# Patient Record
Sex: Female | Born: 1970 | Race: White | Hispanic: No | Marital: Married | Smoking: Current every day smoker
Health system: Southern US, Community
[De-identification: ages and names within clinical notes are randomized; demographics above are authoritative.]

## PROBLEM LIST (undated history)

## (undated) ENCOUNTER — Emergency Department (HOSPITAL_COMMUNITY)

## (undated) DIAGNOSIS — E7800 Pure hypercholesterolemia, unspecified: Secondary | ICD-10-CM

## (undated) DIAGNOSIS — E119 Type 2 diabetes mellitus without complications: Secondary | ICD-10-CM

## (undated) DIAGNOSIS — R42 Dizziness and giddiness: Secondary | ICD-10-CM

## (undated) HISTORY — PX: KNEE SURGERY: SHX244

## (undated) HISTORY — PX: CHOLECYSTECTOMY: SHX55

## (undated) HISTORY — PX: EYE SURGERY: SHX253

---

## 2005-04-08 ENCOUNTER — Ambulatory Visit (HOSPITAL_COMMUNITY): Admission: RE | Admit: 2005-04-08 | Discharge: 2005-04-08 | Payer: Self-pay | Admitting: Family Medicine

## 2006-07-13 ENCOUNTER — Ambulatory Visit (HOSPITAL_COMMUNITY): Payer: Self-pay | Admitting: Oncology

## 2006-07-13 ENCOUNTER — Encounter (HOSPITAL_COMMUNITY): Admission: RE | Admit: 2006-07-13 | Discharge: 2006-08-12 | Payer: Self-pay | Admitting: Oncology

## 2006-10-06 ENCOUNTER — Encounter (HOSPITAL_COMMUNITY): Admission: RE | Admit: 2006-10-06 | Discharge: 2006-11-05 | Payer: Self-pay | Admitting: Oncology

## 2006-10-06 ENCOUNTER — Ambulatory Visit (HOSPITAL_COMMUNITY): Payer: Self-pay | Admitting: Oncology

## 2006-10-28 ENCOUNTER — Ambulatory Visit (HOSPITAL_COMMUNITY): Admission: RE | Admit: 2006-10-28 | Discharge: 2006-10-28 | Payer: Self-pay | Admitting: Family Medicine

## 2006-12-07 ENCOUNTER — Ambulatory Visit (HOSPITAL_COMMUNITY): Payer: Self-pay | Admitting: Oncology

## 2006-12-07 ENCOUNTER — Encounter (HOSPITAL_COMMUNITY): Admission: RE | Admit: 2006-12-07 | Discharge: 2006-12-29 | Payer: Self-pay | Admitting: Oncology

## 2007-09-17 ENCOUNTER — Encounter: Admission: RE | Admit: 2007-09-17 | Discharge: 2007-09-17 | Payer: Self-pay | Admitting: Neurosurgery

## 2008-04-13 ENCOUNTER — Encounter
Admission: RE | Admit: 2008-04-13 | Discharge: 2008-04-13 | Payer: Self-pay | Admitting: Physical Medicine & Rehabilitation

## 2008-11-24 ENCOUNTER — Emergency Department (HOSPITAL_COMMUNITY): Admission: EM | Admit: 2008-11-24 | Discharge: 2008-11-25 | Payer: Self-pay | Admitting: Emergency Medicine

## 2010-07-06 LAB — URINALYSIS, ROUTINE W REFLEX MICROSCOPIC
Bilirubin Urine: NEGATIVE
Glucose, UA: >1000 mg/dL — AB
Ketones, ur: NEGATIVE mg/dL
Leukocytes, UA: NEGATIVE
pH: 5.5 (ref 5.0–8.0)

## 2010-07-06 LAB — URINE MICROSCOPIC-ADD ON

## 2010-07-06 LAB — GLUCOSE, CAPILLARY: Glucose-Capillary: 236 mg/dL — ABNORMAL HIGH (ref 70–99)

## 2011-01-10 LAB — DIFFERENTIAL
Basophils Absolute: 0.1
Basophils Relative: 1
Eosinophils Relative: 2
Monocytes Absolute: 0.9 — ABNORMAL HIGH
Monocytes Relative: 7
Neutro Abs: 7.1
Neutrophils Relative %: 57

## 2011-01-10 LAB — CBC
MCV: 93.2
RBC: 4.64
WBC: 12.5 — ABNORMAL HIGH

## 2011-01-14 LAB — CBC
HCT: 42.9
Hemoglobin: 14.9
MCV: 91.3
Platelets: 284

## 2011-01-14 LAB — DIFFERENTIAL
Basophils Absolute: 0.1
Eosinophils Relative: 2
Lymphocytes Relative: 40
Monocytes Absolute: 1 — ABNORMAL HIGH
Monocytes Relative: 9
Neutro Abs: 5.6
Neutrophils Relative %: 49

## 2014-04-25 ENCOUNTER — Ambulatory Visit (HOSPITAL_COMMUNITY)
Admission: RE | Admit: 2014-04-25 | Discharge: 2014-04-25 | Disposition: A | Discharge status: Home / Self Care | Payer: Self-pay | Source: Ambulatory Visit | Attending: Family Medicine | Admitting: Family Medicine

## 2014-04-25 ENCOUNTER — Other Ambulatory Visit (HOSPITAL_COMMUNITY): Payer: Self-pay | Admitting: Family Medicine

## 2014-04-25 DIAGNOSIS — M545 Low back pain, unspecified: Secondary | ICD-10-CM

## 2014-04-25 DIAGNOSIS — W000XXA Fall on same level due to ice and snow, initial encounter: Secondary | ICD-10-CM | POA: Insufficient documentation

## 2016-10-29 ENCOUNTER — Emergency Department (HOSPITAL_COMMUNITY)
Admission: EM | Admit: 2016-10-29 | Discharge: 2016-10-29 | Disposition: A | Discharge status: Home / Self Care | Payer: Self-pay | Attending: Emergency Medicine | Admitting: Emergency Medicine

## 2016-10-29 ENCOUNTER — Encounter (HOSPITAL_COMMUNITY): Payer: Self-pay | Admitting: Emergency Medicine

## 2016-10-29 DIAGNOSIS — X500XXA Overexertion from strenuous movement or load, initial encounter: Secondary | ICD-10-CM | POA: Insufficient documentation

## 2016-10-29 DIAGNOSIS — Z7984 Long term (current) use of oral hypoglycemic drugs: Secondary | ICD-10-CM | POA: Insufficient documentation

## 2016-10-29 DIAGNOSIS — S39012A Strain of muscle, fascia and tendon of lower back, initial encounter: Secondary | ICD-10-CM | POA: Insufficient documentation

## 2016-10-29 DIAGNOSIS — Y9389 Activity, other specified: Secondary | ICD-10-CM | POA: Insufficient documentation

## 2016-10-29 DIAGNOSIS — Z79899 Other long term (current) drug therapy: Secondary | ICD-10-CM | POA: Insufficient documentation

## 2016-10-29 DIAGNOSIS — Y998 Other external cause status: Secondary | ICD-10-CM | POA: Insufficient documentation

## 2016-10-29 DIAGNOSIS — Y929 Unspecified place or not applicable: Secondary | ICD-10-CM | POA: Insufficient documentation

## 2016-10-29 DIAGNOSIS — E119 Type 2 diabetes mellitus without complications: Secondary | ICD-10-CM | POA: Insufficient documentation

## 2016-10-29 HISTORY — DX: Type 2 diabetes mellitus without complications: E11.9

## 2016-10-29 MED ORDER — CYCLOBENZAPRINE HCL 10 MG PO TABS: 10.0000 mg | ORAL_TABLET | Freq: Two times a day (BID) | ORAL | 0 refills | Status: DC | PRN

## 2016-10-29 MED ORDER — NAPROXEN 250 MG PO TABS
500.0000 mg | ORAL_TABLET | Freq: Once | ORAL | Status: AC
  Administered 2016-10-29: 500 mg via ORAL
  Filled 2016-10-29: qty 2

## 2016-10-29 MED ORDER — NAPROXEN 500 MG PO TABS: 500.0000 mg | ORAL_TABLET | Freq: Two times a day (BID) | ORAL | 0 refills | Status: DC

## 2016-10-29 NOTE — ED Provider Notes (Signed)
[facility] DEPT Provider Note   CSN: [id number] Arrival date & time: 10/29/16  1003     History   Chief Complaint Chief Complaint  Patient presents with  . Back Pain    HPI Cheryl Dickerson is a 46 y.o. female.  The history is provided by the patient.  Back Pain   This is a new problem. The current episode started 2 days ago. The problem occurs constantly. The problem has not changed since onset.The pain is associated with lifting heavy objects (Patient symptoms started after she was bending over at work). The pain is present in the lumbar spine. The quality of the pain is described as aching. The pain is moderate. The symptoms are aggravated by bending, twisting and certain positions. Pertinent negatives include no chest pain, no fever, no abdominal pain, no dysuria, no paresthesias, no paresis, no tingling and no weakness.    Past Medical History:  Diagnosis Date  . Diabetes mellitus without complication ([place])     There are no active problems to display for this patient.   Past Surgical History:  Procedure Laterality Date  . CESAREAN SECTION    . CHOLECYSTECTOMY    . EYE SURGERY    . KNEE SURGERY      OB History    No data available       Home Medications    Prior to Admission medications   Medication Sig Start Date End Date Taking? Authorizing Provider  ibuprofen (ADVIL,MOTRIN) 200 MG tablet Take 800 mg by mouth every 6 (six) hours as needed.   Yes [provider]  metFORMIN (GLUCOPHAGE) 500 MG tablet Take 500 mg by mouth 2 (two) times daily with a meal.   Yes [provider]  cyclobenzaprine (FLEXERIL) 10 MG tablet Take 1 tablet (10 mg total) by mouth 2 (two) times daily as needed for muscle spasms. 10/29/16   Dorie Rank, MD  naproxen (NAPROSYN) 500 MG tablet Take 1 tablet (500 mg total) by mouth 2 (two) times daily. 10/29/16   Dorie Rank, MD    Family History No family history on file.  Social History Social History  Substance Use  Topics  . Smoking status: Current Every Day Smoker    Packs/day: 0.50    Types: Cigarettes  . Smokeless tobacco: Never Used  . Alcohol use Yes     Comment: rare     Allergies   Patient has no known allergies.   Review of Systems Review of Systems  Constitutional: Negative for fever.  Cardiovascular: Negative for chest pain.  Gastrointestinal: Negative for abdominal pain.  Genitourinary: Negative for dysuria.  Musculoskeletal: Positive for back pain.  Neurological: Negative for tingling, weakness and paresthesias.  All other systems reviewed and are negative.    Physical Exam Updated Vital Signs BP 126/86   Pulse 96   Temp 98.5 F (36.9 C)   Resp 18   Ht 1.524 m (5')   Wt 64 kg (141 lb)   LMP 10/20/2016   SpO2 98%   BMI 27.54 kg/m   Physical Exam  Constitutional: She appears well-developed and well-nourished. No distress.  HENT:  Head: Normocephalic and atraumatic.  Right Ear: External ear normal.  Left Ear: External ear normal.  Nose: Nose normal.  Eyes: Conjunctivae and EOM are normal. Right eye exhibits no discharge. Left eye exhibits no discharge. No scleral icterus.  Neck: Neck supple. No tracheal deviation present.  Cardiovascular: Normal rate.   Pulmonary/Chest: Effort normal. No stridor. No respiratory distress.  Abdominal: She exhibits no distension.  Musculoskeletal: She exhibits no edema or tenderness.       Lumbar back: She exhibits decreased range of motion, pain and spasm. She exhibits no swelling and no edema.  Neurological: She is alert. She is not disoriented. No cranial nerve deficit or sensory deficit. She exhibits normal muscle tone. Coordination normal.  Skin: Skin is warm and dry. No rash noted. She is not diaphoretic. No erythema.  Psychiatric: She has a normal mood and affect. Her behavior is normal. Thought content normal.  Nursing note and vitals reviewed.    ED Treatments / Results  Labs (all labs ordered are listed, but only  abnormal results are displayed) Labs Reviewed - No data to display    Procedures Procedures (including critical care time)  Medications Ordered in ED Medications  naproxen (NAPROSYN) tablet 500 mg (500 mg Oral Given 10/29/16 1047)     Initial Impression / Assessment and Plan / ED Course  I have reviewed the triage vital signs and the nursing notes.  Pertinent labs & imaging results that were available during my care of the patient were reviewed by me and considered in my medical decision making (see chart for details).    No sign of acute neurological or vascular emergency associated with pt's back pain.  Doubt sciatica.  Safe for outpatient follow up.   Final Clinical Impressions(s) / ED Diagnoses   Final diagnoses:  Strain of lumbar region, initial encounter    New Prescriptions New Prescriptions   CYCLOBENZAPRINE (FLEXERIL) 10 MG TABLET    Take 1 tablet (10 mg total) by mouth 2 (two) times daily as needed for muscle spasms.   NAPROXEN (NAPROSYN) 500 MG TABLET    Take 1 tablet (500 mg total) by mouth 2 (two) times daily.     Dorie Rank, MD 10/29/16 1054

## 2016-10-29 NOTE — ED Triage Notes (Signed)
Pt c/o lower back pain since Monday. Denies injury. Denies gi/gu sx.

## 2016-10-29 NOTE — Discharge Instructions (Signed)
Take the medications as needed for the back pain, consider using warm compresses, massage, chiropractor or physical therapy to help with your back pain.   Notify your job since your pain seems to be related to a work injury

## 2017-09-23 ENCOUNTER — Emergency Department (HOSPITAL_COMMUNITY)
Admission: EM | Admit: 2017-09-23 | Discharge: 2017-09-23 | Disposition: A | Discharge status: Home / Self Care | Payer: Self-pay | Attending: Emergency Medicine | Admitting: Emergency Medicine

## 2017-09-23 ENCOUNTER — Encounter (HOSPITAL_COMMUNITY): Payer: Self-pay | Admitting: Emergency Medicine

## 2017-09-23 ENCOUNTER — Other Ambulatory Visit: Payer: Self-pay

## 2017-09-23 DIAGNOSIS — L02215 Cutaneous abscess of perineum: Secondary | ICD-10-CM | POA: Insufficient documentation

## 2017-09-23 DIAGNOSIS — E119 Type 2 diabetes mellitus without complications: Secondary | ICD-10-CM | POA: Insufficient documentation

## 2017-09-23 DIAGNOSIS — Z79899 Other long term (current) drug therapy: Secondary | ICD-10-CM | POA: Insufficient documentation

## 2017-09-23 DIAGNOSIS — F17210 Nicotine dependence, cigarettes, uncomplicated: Secondary | ICD-10-CM | POA: Insufficient documentation

## 2017-09-23 DIAGNOSIS — Z7984 Long term (current) use of oral hypoglycemic drugs: Secondary | ICD-10-CM | POA: Insufficient documentation

## 2017-09-23 DIAGNOSIS — L0291 Cutaneous abscess, unspecified: Secondary | ICD-10-CM

## 2017-09-23 MED ORDER — LIDOCAINE HCL (PF) 1 % IJ SOLN
INTRAMUSCULAR | Status: AC
  Administered 2017-09-23: 11:00:00
  Filled 2017-09-23: qty 4

## 2017-09-23 MED ORDER — IBUPROFEN 600 MG PO TABS: 600.0000 mg | ORAL_TABLET | Freq: Four times a day (QID) | ORAL | 0 refills | Status: DC | PRN

## 2017-09-23 MED ORDER — POVIDONE-IODINE 10 % EX SOLN
CUTANEOUS | Status: AC
  Administered 2017-09-23: 1
  Filled 2017-09-23: qty 15

## 2017-09-23 MED ORDER — SULFAMETHOXAZOLE-TRIMETHOPRIM 800-160 MG PO TABS
1.0000 | ORAL_TABLET | Freq: Once | ORAL | Status: AC
  Administered 2017-09-23: 1 via ORAL
  Filled 2017-09-23: qty 1

## 2017-09-23 MED ORDER — SULFAMETHOXAZOLE-TRIMETHOPRIM 800-160 MG PO TABS: 1.0000 | ORAL_TABLET | Freq: Two times a day (BID) | ORAL | 0 refills | Status: AC

## 2017-09-23 MED ORDER — HYDROCODONE-ACETAMINOPHEN 5-325 MG PO TABS: 1.0000 | ORAL_TABLET | ORAL | 0 refills | Status: DC | PRN

## 2017-09-23 NOTE — ED Triage Notes (Signed)
Pt c/o of left groin pain since Monday. Pt saw PCP and told she has a cyst.  Pain worsened today. HR 122.  Pt is very anxious

## 2017-09-23 NOTE — ED Provider Notes (Signed)
[facility] EMERGENCY DEPARTMENT Provider Note   CSN: [id number] Arrival date & time: 09/23/17  5102     History   Chief Complaint Chief Complaint  Patient presents with  . Groin Pain    HPI Cheryl Dickerson is a 47 y.o. female presenting with abscess to her perineal area.  She reports having a small tender papule at the site, was seen by her pcp 2 days ago, who offered I&D, but patient deferred.  She reports worsening pain and swelling despite warm soak applications.  She is unable to sit today secondary to pain.  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  She denies fevers, chills, n/v or other complaint.  HPI  Past Medical History:  Diagnosis Date  . Diabetes mellitus without complication ([place])     There are no active problems to display for this patient.   Past Surgical History:  Procedure Laterality Date  . CESAREAN SECTION    . CHOLECYSTECTOMY    . EYE SURGERY    . KNEE SURGERY       OB History   None      Home Medications    Prior to Admission medications   Medication Sig Start Date End Date Taking? Authorizing Provider  metFORMIN (GLUCOPHAGE) 500 MG tablet Take 500 mg by mouth 2 (two) times daily with a meal.   Yes [provider]  HYDROcodone-acetaminophen (NORCO/VICODIN) 5-325 MG tablet Take 1 tablet by mouth every 4 (four) hours as needed for severe pain. 09/23/17   Evalee Jefferson, PA-C  ibuprofen (ADVIL,MOTRIN) 600 MG tablet Take 1 tablet (600 mg total) by mouth every 6 (six) hours as needed for moderate pain. 09/23/17   Evalee Jefferson, PA-C  sulfamethoxazole-trimethoprim (BACTRIM DS,SEPTRA DS) 800-160 MG tablet Take 1 tablet by mouth 2 (two) times daily for 10 days. 09/23/17 10/03/17  Evalee Jefferson, PA-C    Family History No family history on file.  Social History Social History   Tobacco Use  . Smoking status: Current Every Day Smoker    Packs/day: 0.50    Types: Cigarettes  . Smokeless tobacco: Never Used    Substance Use Topics  . Alcohol use: Yes    Comment: rare  . Drug use: No     Allergies   Patient has no known allergies.   Review of Systems Review of Systems  Constitutional: Negative for chills and fever.  HENT: Negative for congestion.   Eyes: Negative.   Respiratory: Negative.   Cardiovascular: Negative.   Gastrointestinal: Negative for abdominal pain, nausea and vomiting.  Genitourinary: Negative.   Musculoskeletal: Negative for arthralgias, joint swelling and neck pain.  Skin: Positive for color change.  Neurological: Negative for dizziness, weakness, light-headedness, numbness and headaches.  Psychiatric/Behavioral: Negative.      Physical Exam Updated Vital Signs BP 124/87 (BP Location: Right Arm)   Pulse (!) 122   Temp 98.2 F (36.8 C) (Oral)   Resp 20   Ht 5' (1.524 m)   Wt 63.5 kg (140 lb)   SpO2 100%   BMI 27.34 kg/m   Physical Exam  Constitutional: She appears well-developed and well-nourished. No distress.  HENT:  Head: Normocephalic.  Neck: Neck supple.  Cardiovascular: Normal rate.  Pulmonary/Chest: Effort normal. She has no wheezes.  Musculoskeletal: Normal range of motion. She exhibits no edema.  Skin: There is erythema.  Raised fluctuant 2 cm abscess of left buttock/perineum with the maximum point of fluctuance  Posterior to the  edge of the labia.  No labial or anal involvement.  Erythema extending 4 cm toward the gluteal fold.      ED Treatments / Results  Labs (all labs ordered are listed, but only abnormal results are displayed) Labs Reviewed - No data to display  EKG None  Radiology No results found.  Procedures Procedures (including critical care time)  INCISION AND DRAINAGE Performed by: Evalee Jefferson Consent: Verbal consent obtained. Risks and benefits: risks, benefits and alternatives were discussed Type: abscess  Body area: left perineum  Anesthesia: local infiltration  Incision was made with a  scalpel.  Local anesthetic: lidocaine 1% without epinephrine  Anesthetic total: 3 ml  Complexity: complex Blunt dissection to break up loculations  Drainage: purulent  Drainage amount: copious  Packing material: no packing  Patient tolerance: Patient tolerated the procedure well with no immediate complications.     Medications Ordered in ED Medications  sulfamethoxazole-trimethoprim (BACTRIM DS,SEPTRA DS) 800-160 MG per tablet 1 tablet (has no administration in time range)  lidocaine (PF) (XYLOCAINE) 1 % injection (  Given 09/23/17 1057)  povidone-iodine (BETADINE) 10 % external solution (1 application  Given 11/26/98 1057)     Initial Impression / Assessment and Plan / ED Course  I have reviewed the triage vital signs and the nursing notes.  Pertinent labs & imaging results that were available during my care of the patient were reviewed by me and considered in my medical decision making (see chart for details).     Pt tolerated procedure well.  Site not amenable to packing, discussed warm soaks. Pt started on bactrim, also prescribed hydrocodone and ibuprofen. Plan f/u with pcp or return here for any persistent or worsening sx.    Final Clinical Impressions(s) / ED Diagnoses   Final diagnoses:  Abscess    ED Discharge Orders        Ordered    sulfamethoxazole-trimethoprim (BACTRIM DS,SEPTRA DS) 800-160 MG tablet  2 times daily     09/23/17 1029    HYDROcodone-acetaminophen (NORCO/VICODIN) 5-325 MG tablet  Every 4 hours PRN     09/23/17 1029    ibuprofen (ADVIL,MOTRIN) 600 MG tablet  Every 6 hours PRN     09/23/17 1029       Evalee Jefferson, PA-C 09/23/17 1218    Long, Wonda [facility], MD 09/23/17 1235

## 2017-09-23 NOTE — Discharge Instructions (Addendum)
You will need to apply warm water soaks (ideally with epsom salt water) to keep this site draining for the next few days.  The antibiotic needs to be fully completed, take your next dose tonight.  Get rechecked for any worsening pain, swelling, fevers or other new symptoms.

## 2018-05-19 ENCOUNTER — Encounter (HOSPITAL_COMMUNITY): Payer: Self-pay | Admitting: *Deleted

## 2018-05-19 ENCOUNTER — Other Ambulatory Visit: Payer: Self-pay

## 2018-05-19 ENCOUNTER — Emergency Department (HOSPITAL_COMMUNITY)
Admission: EM | Admit: 2018-05-19 | Discharge: 2018-05-19 | Disposition: A | Discharge status: Home / Self Care | Payer: Self-pay | Attending: Emergency Medicine | Admitting: Emergency Medicine

## 2018-05-19 DIAGNOSIS — Z7984 Long term (current) use of oral hypoglycemic drugs: Secondary | ICD-10-CM | POA: Insufficient documentation

## 2018-05-19 DIAGNOSIS — E119 Type 2 diabetes mellitus without complications: Secondary | ICD-10-CM | POA: Insufficient documentation

## 2018-05-19 DIAGNOSIS — J069 Acute upper respiratory infection, unspecified: Secondary | ICD-10-CM | POA: Insufficient documentation

## 2018-05-19 DIAGNOSIS — J029 Acute pharyngitis, unspecified: Secondary | ICD-10-CM | POA: Insufficient documentation

## 2018-05-19 DIAGNOSIS — F17210 Nicotine dependence, cigarettes, uncomplicated: Secondary | ICD-10-CM | POA: Insufficient documentation

## 2018-05-19 LAB — GROUP A STREP BY PCR: Group A Strep by PCR: NOT DETECTED

## 2018-05-19 LAB — CBG MONITORING, ED: Glucose-Capillary: 310 mg/dL — ABNORMAL HIGH (ref 70–99)

## 2018-05-19 NOTE — ED Notes (Signed)
ED Provider at bedside. 

## 2018-05-19 NOTE — ED Provider Notes (Signed)
[facility] EMERGENCY DEPARTMENT Provider Note   CSN: [id number] Arrival date & time: 05/19/18  1036    History   Chief Complaint Chief Complaint  Patient presents with  . Sore Throat    HPI Cheryl Dickerson is a 48 y.o. female.     Patient with onset of sore throat yesterday and then cough and congestion today.  No fevers.  Patient does have a history of diabetes.  Blood sugar here 300.  Patient without any nausea vomiting or diarrhea.  Did not have the flu shot this year.  No body aches.     Past Medical History:  Diagnosis Date  . Diabetes mellitus without complication ([place])     There are no active problems to display for this patient.   Past Surgical History:  Procedure Laterality Date  . CESAREAN SECTION    . CHOLECYSTECTOMY    . EYE SURGERY    . KNEE SURGERY       OB History   No obstetric history on file.      Home Medications    Prior to Admission medications   Medication Sig Start Date End Date Taking? Authorizing Provider  ibuprofen (ADVIL,MOTRIN) 600 MG tablet Take 1 tablet (600 mg total) by mouth every 6 (six) hours as needed for moderate pain. 09/23/17  Yes Idol, Almyra Free, PA-C  metFORMIN (GLUCOPHAGE) 500 MG tablet Take 500 mg by mouth 2 (two) times daily with a meal.   Yes [provider]  HYDROcodone-acetaminophen (NORCO/VICODIN) 5-325 MG tablet Take 1 tablet by mouth every 4 (four) hours as needed for severe pain. Patient not taking: Reported on 05/19/2018 09/23/17   Evalee Jefferson, PA-C    Family History No family history on file.  Social History Social History   Tobacco Use  . Smoking status: Current Every Day Smoker    Packs/day: 0.50    Types: Cigarettes  . Smokeless tobacco: Never Used  Substance Use Topics  . Alcohol use: Yes    Comment: rare  . Drug use: No     Allergies   Patient has no known allergies.   Review of Systems Review of Systems  Constitutional: Negative for chills and fever.  HENT: Positive for  congestion and sore throat. Negative for rhinorrhea.   Eyes: Negative for photophobia, redness and visual disturbance.  Respiratory: Positive for cough. Negative for shortness of breath.   Cardiovascular: Negative for chest pain and leg swelling.  Gastrointestinal: Negative for abdominal pain, diarrhea, nausea and vomiting.  Genitourinary: Negative for dysuria.  Musculoskeletal: Negative for back pain, myalgias, neck pain and neck stiffness.  Skin: Negative for rash.  Neurological: Negative for dizziness, light-headedness and headaches.  Hematological: Does not bruise/bleed easily.  Psychiatric/Behavioral: Negative for confusion.     Physical Exam Updated Vital Signs BP 122/84   Pulse 91   Temp 98.1 F (36.7 C) (Oral)   Resp 20   Ht 1.6 m (5\' 3" )   Wt 63 kg   SpO2 100%   BMI 24.62 kg/m   Physical Exam Vitals signs and nursing note reviewed.  Constitutional:      General: She is not in acute distress.    Appearance: She is well-developed.  HENT:     Head: Normocephalic and atraumatic.     Nose: Congestion present.     Mouth/Throat:     Mouth: Mucous membranes are moist.     Pharynx: Oropharyngeal exudate and posterior oropharyngeal erythema present.     Comments: Uvula midline.  Exudate  predominantly on the right side.  Bilateral tonsils are enlarged. Eyes:     Extraocular Movements: Extraocular movements intact.     Conjunctiva/sclera: Conjunctivae normal.     Pupils: Pupils are equal, round, and reactive to light.  Neck:     Musculoskeletal: Normal range of motion and neck supple. No neck rigidity.  Cardiovascular:     Rate and Rhythm: Normal rate and regular rhythm.     Heart sounds: Normal heart sounds. No murmur.  Pulmonary:     Effort: Pulmonary effort is normal. No respiratory distress.     Breath sounds: Normal breath sounds.  Abdominal:     Palpations: Abdomen is soft.     Tenderness: There is no abdominal tenderness.  Musculoskeletal: Normal range of  motion.  Skin:    General: Skin is warm and dry.  Neurological:     General: No focal deficit present.     Mental Status: She is alert and oriented to person, place, and time.      ED Treatments / Results  Labs (all labs ordered are listed, but only abnormal results are displayed) Labs Reviewed  CBG MONITORING, ED - Abnormal; Notable for the following components:      Result Value   Glucose-Capillary 310 (*)    All other components within normal limits  GROUP A STREP BY PCR    EKG None  Radiology No results found.  Procedures Procedures (including critical care time)  Medications Ordered in ED Medications - No data to display   Initial Impression / Assessment and Plan / ED Course  I have reviewed the triage vital signs and the nursing notes.  Pertinent labs & imaging results that were available during my care of the patient were reviewed by me and considered in my medical decision making (see chart for details).       Patient without a history of strep throat.  But strep negative.  Uvula midline but does have erythema and some exudate predominantly on the right side.  Tonsils are enlarged.   Symptoms do not seem to be influenza in nature.  Suspect that this is an acute pharyngitis upper respiratory infection.  Will treat symptomatically.  Patient will use over-the-counter medications.  Work note provided.  She will follow-up with her doctor or return here for any new or worse symptoms.  Will not start on Tamiflu.  Does not seem to be influenza in nature.  Final Clinical Impressions(s) / ED Diagnoses   Final diagnoses:  Acute pharyngitis, unspecified etiology  Viral upper respiratory tract infection    ED Discharge Orders    None       Fredia Sorrow, MD 05/19/18 1429

## 2018-05-19 NOTE — Discharge Instructions (Addendum)
Recommend Motrin or Naprosyn or Aleve or Advil for the throat pain.  Also recommend Mucinex DM for the cough and congestion.  Work note provided.  Return for any new or worse symptoms or follow-up with your primary care doctor.

## 2018-05-19 NOTE — ED Triage Notes (Signed)
Cough with sore throat onset yesterday

## 2018-06-14 ENCOUNTER — Emergency Department (HOSPITAL_COMMUNITY): Payer: Self-pay

## 2018-06-14 ENCOUNTER — Other Ambulatory Visit: Payer: Self-pay

## 2018-06-14 ENCOUNTER — Emergency Department (HOSPITAL_COMMUNITY)
Admission: EM | Admit: 2018-06-14 | Discharge: 2018-06-14 | Disposition: A | Discharge status: Home / Self Care | Payer: Self-pay | Attending: Emergency Medicine | Admitting: Emergency Medicine

## 2018-06-14 ENCOUNTER — Encounter (HOSPITAL_COMMUNITY): Payer: Self-pay | Admitting: Emergency Medicine

## 2018-06-14 DIAGNOSIS — Z7984 Long term (current) use of oral hypoglycemic drugs: Secondary | ICD-10-CM | POA: Insufficient documentation

## 2018-06-14 DIAGNOSIS — E119 Type 2 diabetes mellitus without complications: Secondary | ICD-10-CM | POA: Insufficient documentation

## 2018-06-14 DIAGNOSIS — M79641 Pain in right hand: Secondary | ICD-10-CM | POA: Insufficient documentation

## 2018-06-14 DIAGNOSIS — F17210 Nicotine dependence, cigarettes, uncomplicated: Secondary | ICD-10-CM | POA: Insufficient documentation

## 2018-06-14 MED ORDER — HYDROCODONE-ACETAMINOPHEN 5-325 MG PO TABS: 1.0000 | ORAL_TABLET | ORAL | 0 refills | Status: DC | PRN

## 2018-06-14 MED ORDER — IBUPROFEN 600 MG PO TABS: 600.0000 mg | ORAL_TABLET | Freq: Four times a day (QID) | ORAL | 0 refills | Status: DC | PRN

## 2018-06-14 NOTE — Discharge Instructions (Addendum)
Your x-rays and exam are normal today.  Your distribution of pain suggests an extensor tendinitis of your right thumb.  You may try using a heating pad 20 minutes several times daily.  I advise continuing an anti-inflammatory like the ibuprofen prescribed.  You have also been prescribed hydrocodone for additional pain relief.  This medication will make you drowsy so do not drive within 4 hours of taking this medication.  Wear the splint for comfort and to minimize movement of your thumb.  Plan to see your doctor for recheck if not improved over the next week.

## 2018-06-14 NOTE — ED Notes (Signed)
Pt verbalized understanding of no driving and to use caution within 4 hours of taking pain meds due to meds cause drowsiness 

## 2018-06-14 NOTE — ED Triage Notes (Signed)
Patient reports awakening with sudden pain in her R hand last night. Xrayed at work and Vet looked at [employer], thought there might be a fracture.

## 2018-06-14 NOTE — ED Provider Notes (Signed)
[facility] EMERGENCY DEPARTMENT Provider Note   CSN: [id number] Arrival date & time: 06/14/18  1744    History   Chief Complaint Chief Complaint  Patient presents with  . Hand Pain    HPI Cheryl Dickerson is a 48 y.o. female.     The history is provided by the patient.  Hand Pain  This is a new problem. The current episode started 12 to 24 hours ago (today. Pt woke with right hand pain this morning.). The problem occurs constantly. The problem has not changed since onset.Pertinent negatives include no chest pain. Associated symptoms comments: Denies weakness or numbness in the hand. 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  She is a diabetic with well controlled glucose, cbg today was around 115.  . The symptoms are relieved by rest. She has tried rest for the symptoms.    Past Medical History:  Diagnosis Date  . Diabetes mellitus without complication ([place])     There are no active problems to display for this patient.   Past Surgical History:  Procedure Laterality Date  . CESAREAN SECTION    . CHOLECYSTECTOMY    . EYE SURGERY    . KNEE SURGERY       OB History    Gravida  2   Para  2   Term  2   Preterm      AB      Living        SAB      TAB      Ectopic      Multiple      Live Births               Home Medications    Prior to Admission medications   Medication Sig Start Date End Date Taking? Authorizing Provider  metFORMIN (GLUCOPHAGE) 500 MG tablet Take 500 mg by mouth 2 (two) times daily with a meal.   Yes [provider]  HYDROcodone-acetaminophen (NORCO/VICODIN) 5-325 MG tablet Take 1 tablet by mouth every 4 (four) hours as needed. 06/14/18   Sayre Mazor, Almyra Free, PA-C  ibuprofen (ADVIL,MOTRIN) 600 MG tablet Take 1 tablet (600 mg total) by mouth every 6 (six) hours as needed. 06/14/18   Evalee Jefferson, PA-C    Family History Family History  Problem Relation Age of Onset  . Cancer  Mother   . Heart attack Father   . Diabetes Other     Social History Social History   Tobacco Use  . Smoking status: Current Every Day Smoker    Packs/day: 0.50    Types: Cigarettes  . Smokeless tobacco: Never Used  Substance Use Topics  . Alcohol use: Yes    Comment: rare  . Drug use: No     Allergies   Patient has no known allergies.   Review of Systems Review of Systems  Constitutional: Negative for fever.  Cardiovascular: Negative for chest pain.  Musculoskeletal: Positive for arthralgias. Negative for joint swelling and myalgias.  Neurological: Negative for weakness and numbness.     Physical Exam Updated Vital Signs BP (!) 121/96 (BP Location: Right Arm)   Pulse 100   Temp 97.8 F (36.6 C) (Oral)   Resp 16   Ht 5\' 3"  (1.6 m)   Wt 63 kg   LMP 05/31/2018   SpO2 99%   BMI 24.62 kg/m   Physical Exam Constitutional:      Appearance: She is well-developed.  HENT:     Head: Atraumatic.  Neck:     Musculoskeletal: Normal range of motion.  Cardiovascular:     Pulses:          Radial pulses are 2+ on the right side and 2+ on the left side.     Comments: Pulses equal bilaterally. Less than 2 sec cap refill in fingertips. Musculoskeletal:        General: Tenderness present. No swelling, deformity or signs of injury.     Comments: ttp along extensor pollicus longus of right thumb, pain is worsened with both ext/flex.  No edmea, no crepitus or erythema.  Distal sensation intact.  Lesser degree of pain also involving ROM of right index finger.  Skin:    General: Skin is warm and dry.  Neurological:     Mental Status: She is alert.     Sensory: No sensory deficit.     Deep Tendon Reflexes: Reflexes normal.      ED Treatments / Results  Labs (all labs ordered are listed, but only abnormal results are displayed) Labs Reviewed - No data to display  EKG None  Radiology Dg Hand Complete Right  Result Date: 06/14/2018 CLINICAL DATA:  Right hand pain  EXAM: RIGHT HAND - COMPLETE 3+ VIEW COMPARISON:  None. FINDINGS: There is no evidence of fracture or dislocation. There is no evidence of arthropathy or other focal bone abnormality. Soft tissues are unremarkable. IMPRESSION: Negative. Electronically Signed   By: Rolm Baptise M.D.   On: 06/14/2018 19:11    Procedures Procedures (including critical care time)  Medications Ordered in ED Medications - No data to display   Initial Impression / Assessment and Plan / ED Course  I have reviewed the triage vital signs and the nursing notes.  Pertinent labs & imaging results that were available during my care of the patient were reviewed by me and considered in my medical decision making (see chart for details).        Pt with right thumb and index finger pain without trauma. Imaging reviewed and discussed, suspect overuse, tendinitis.  Discussed RICE, placed in thumb spica. Plan recheck by pcp for recheck if no improvement in sx.  No erythema or edema to suggest infection or gout.   Final Clinical Impressions(s) / ED Diagnoses   Final diagnoses:  Right hand pain    ED Discharge Orders         Ordered    HYDROcodone-acetaminophen (NORCO/VICODIN) 5-325 MG tablet  Every 4 hours PRN     06/14/18 1944    ibuprofen (ADVIL,MOTRIN) 600 MG tablet  Every 6 hours PRN     06/14/18 1944           Landis Martins 06/15/18 1245    Nat Christen, MD 06/17/18 1528

## 2018-12-23 ENCOUNTER — Emergency Department (HOSPITAL_COMMUNITY)
Admission: EM | Admit: 2018-12-23 | Discharge: 2018-12-23 | Disposition: A | Discharge status: Home / Self Care | Payer: Self-pay | Attending: Emergency Medicine | Admitting: Emergency Medicine

## 2018-12-23 ENCOUNTER — Other Ambulatory Visit: Payer: Self-pay

## 2018-12-23 ENCOUNTER — Encounter (HOSPITAL_COMMUNITY): Payer: Self-pay | Admitting: Emergency Medicine

## 2018-12-23 DIAGNOSIS — R1031 Right lower quadrant pain: Secondary | ICD-10-CM | POA: Insufficient documentation

## 2018-12-23 DIAGNOSIS — M25512 Pain in left shoulder: Secondary | ICD-10-CM | POA: Insufficient documentation

## 2018-12-23 DIAGNOSIS — N644 Mastodynia: Secondary | ICD-10-CM | POA: Insufficient documentation

## 2018-12-23 DIAGNOSIS — Z5321 Procedure and treatment not carried out due to patient leaving prior to being seen by health care provider: Secondary | ICD-10-CM | POA: Insufficient documentation

## 2018-12-23 LAB — CBG MONITORING, ED: Glucose-Capillary: 231 mg/dL — ABNORMAL HIGH (ref 70–99)

## 2018-12-23 NOTE — ED Triage Notes (Signed)
Pt c/o left shoulder pain into back over one month. States these sharp pains are now radiating into both breasts and RLQ area. Has apt with pcp next week. Nad. Non diaphoretic. Cough x 1 week

## 2019-01-04 ENCOUNTER — Other Ambulatory Visit: Payer: Self-pay

## 2019-01-04 DIAGNOSIS — Z20822 Contact with and (suspected) exposure to covid-19: Secondary | ICD-10-CM

## 2019-01-06 LAB — NOVEL CORONAVIRUS, NAA: SARS-CoV-2, NAA: NOT DETECTED

## 2019-02-03 ENCOUNTER — Other Ambulatory Visit: Payer: Self-pay

## 2019-02-03 DIAGNOSIS — Z20822 Contact with and (suspected) exposure to covid-19: Secondary | ICD-10-CM

## 2019-02-04 LAB — NOVEL CORONAVIRUS, NAA: SARS-CoV-2, NAA: NOT DETECTED

## 2019-04-27 ENCOUNTER — Ambulatory Visit: Payer: Self-pay | Attending: Internal Medicine

## 2019-04-27 ENCOUNTER — Other Ambulatory Visit: Payer: Self-pay

## 2020-10-01 IMAGING — DX RIGHT HAND - COMPLETE 3+ VIEW
3 series · 3 of 3 positions shown · non-contrast
Comparison: None.

CLINICAL DATA: Right hand pain

EXAM:
RIGHT HAND - COMPLETE 3+ VIEW

[hand pa]
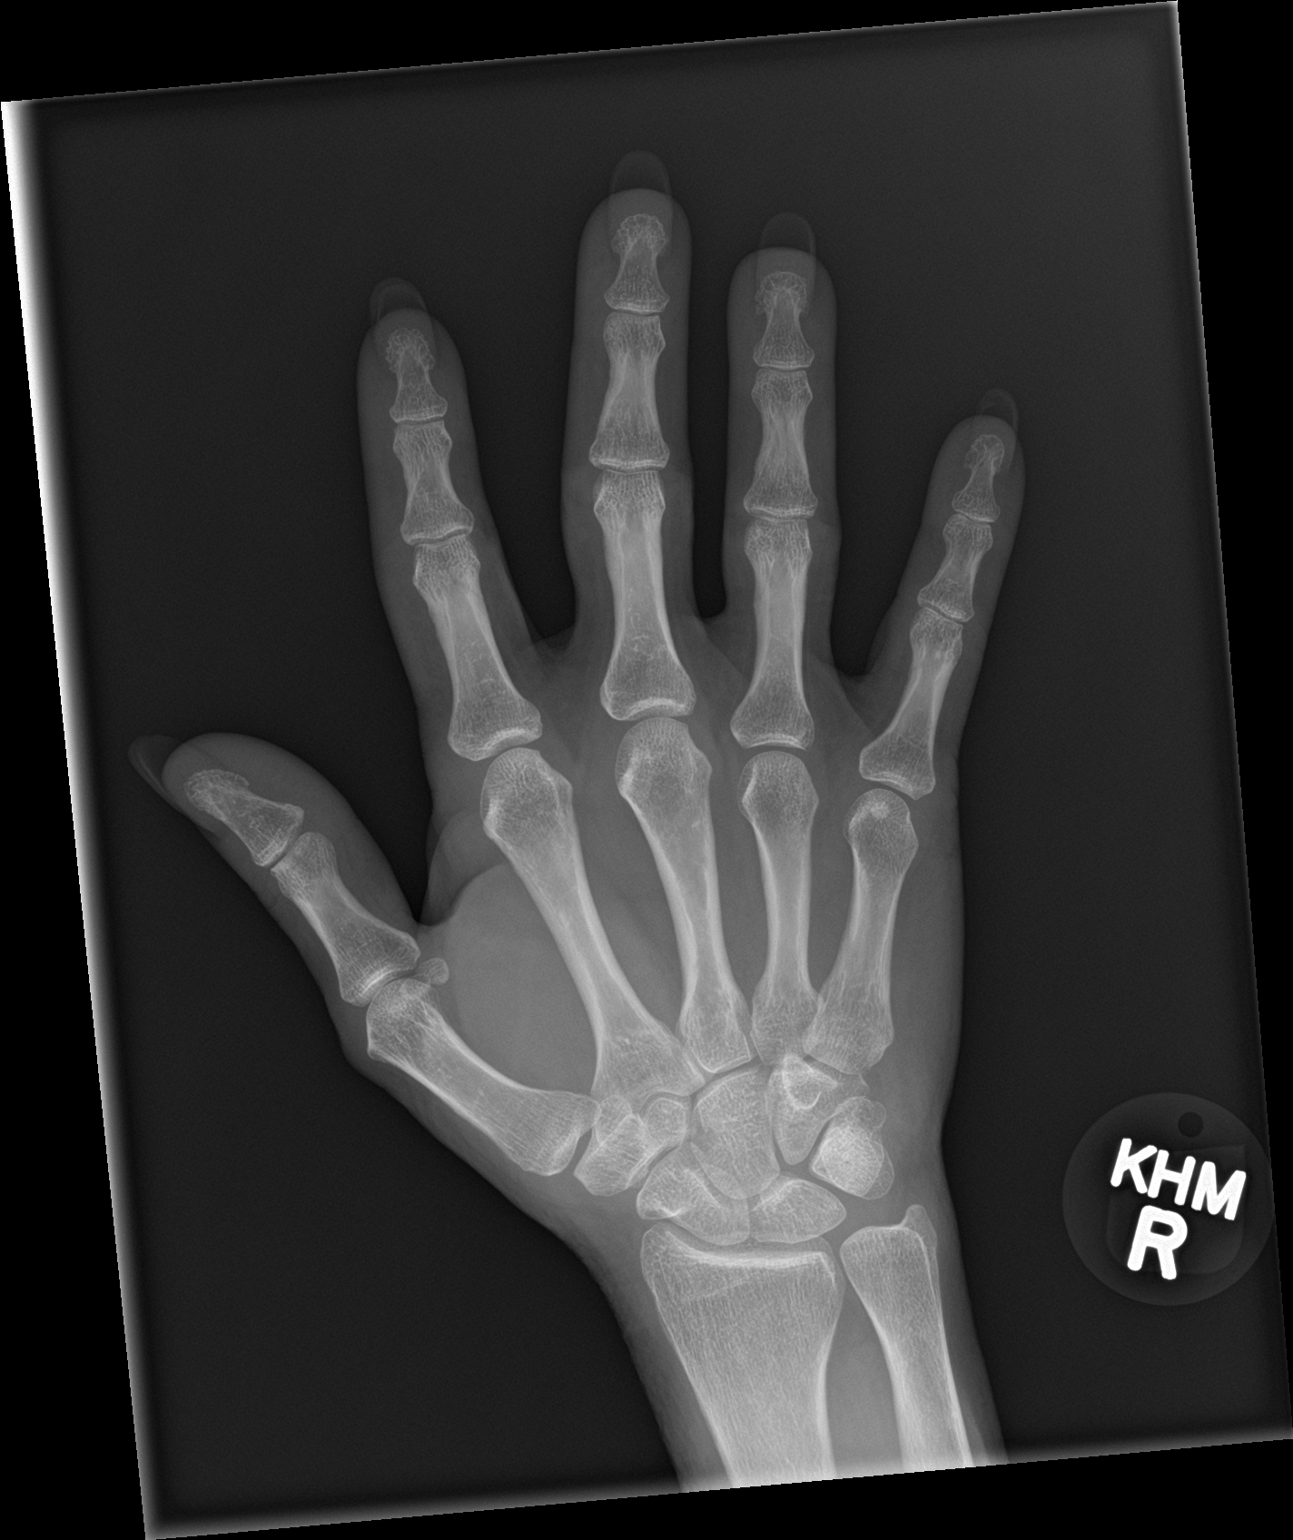

[hand obl]
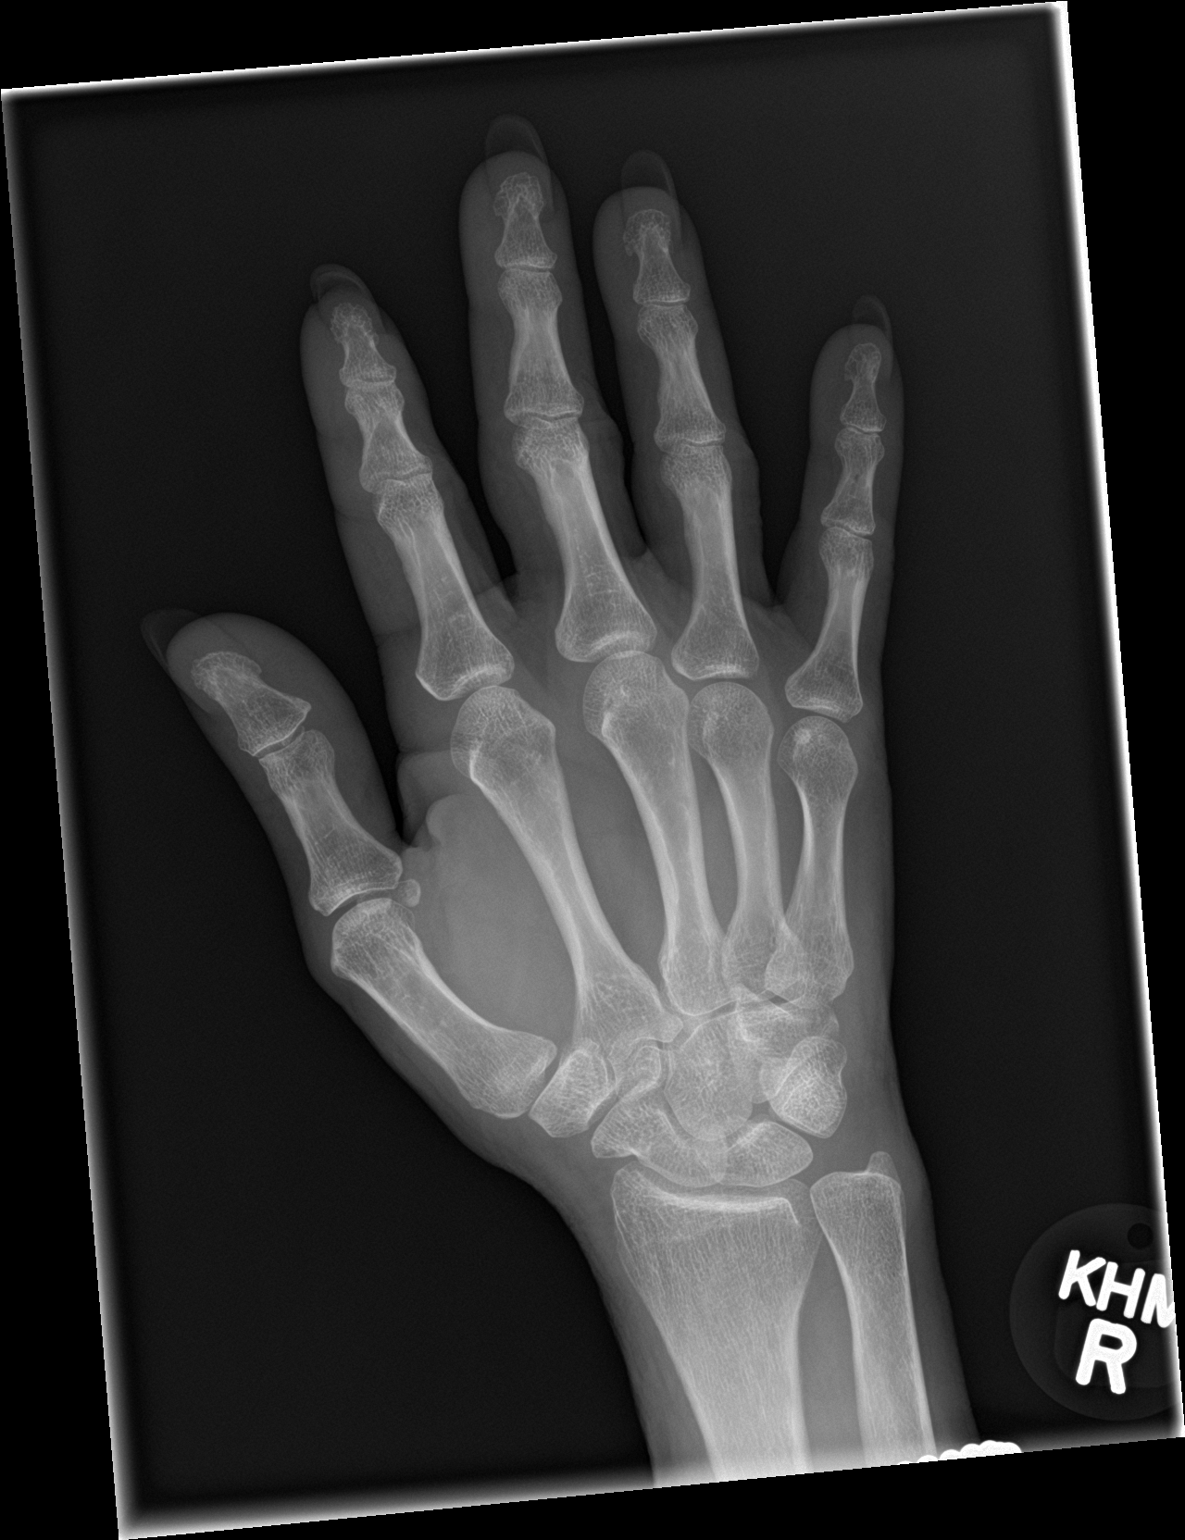

[hand lat]
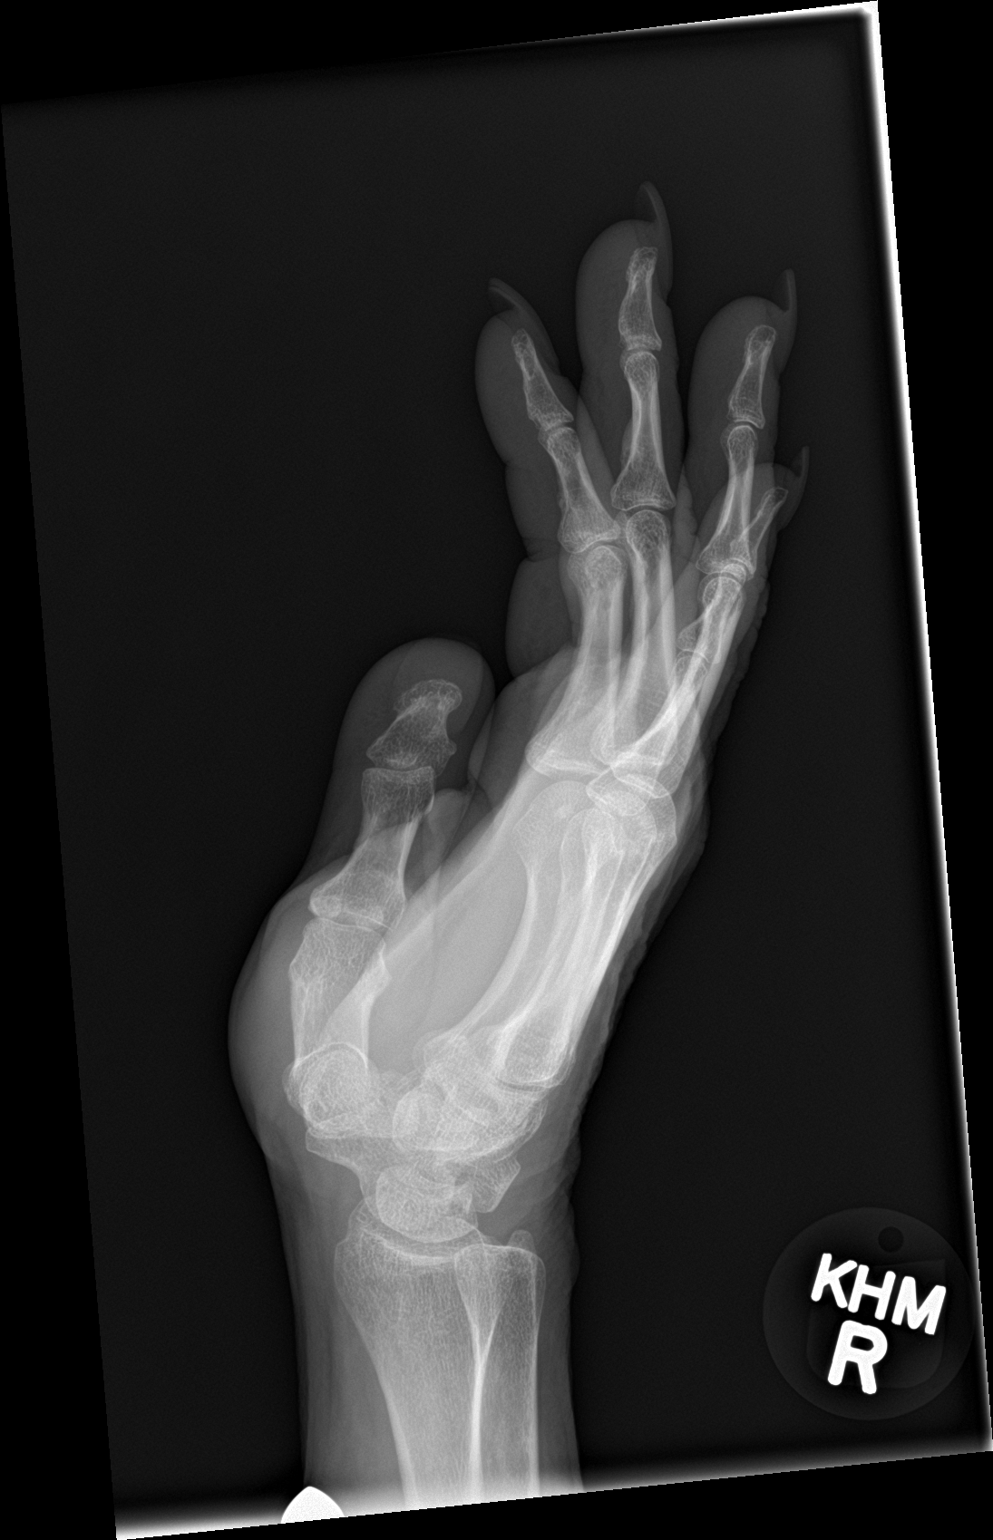

[3 of 3 positions shown; findings below may reference images not displayed]

FINDINGS: There is no evidence of fracture or dislocation. There is no
evidence of arthropathy or other focal bone abnormality. Soft
tissues are unremarkable.
IMPRESSION: Negative.

## 2020-12-10 ENCOUNTER — Ambulatory Visit
Admission: EM | Admit: 2020-12-10 | Discharge: 2020-12-10 | Disposition: A | Discharge status: Home / Self Care | Payer: 59 | Attending: Family | Admitting: Family

## 2020-12-10 ENCOUNTER — Other Ambulatory Visit: Payer: Self-pay

## 2020-12-10 ENCOUNTER — Encounter (HOSPITAL_COMMUNITY): Payer: Self-pay | Admitting: Emergency Medicine

## 2020-12-10 DIAGNOSIS — Z1152 Encounter for screening for COVID-19: Secondary | ICD-10-CM | POA: Diagnosis not present

## 2020-12-10 DIAGNOSIS — J392 Other diseases of pharynx: Secondary | ICD-10-CM | POA: Diagnosis not present

## 2020-12-10 DIAGNOSIS — J029 Acute pharyngitis, unspecified: Secondary | ICD-10-CM | POA: Diagnosis not present

## 2020-12-10 LAB — POCT RAPID STREP A (OFFICE): Rapid Strep A Screen: NEGATIVE

## 2020-12-10 MED ORDER — LIDOCAINE VISCOUS HCL 2 % MT SOLN: 10.0000 mL | OROMUCOSAL | 0 refills | Status: DC | PRN

## 2020-12-10 NOTE — Discharge Instructions (Addendum)
Recommend use Viscous lidocaine 2 teaspoons swish and spit out every 3 to 4 hours as needed for throat pain. May continue Excedrin migraine as directed. Rest. Stay at home. Follow-up pending COVID 19 test results.

## 2020-12-10 NOTE — ED Provider Notes (Signed)
[facility]    CSN: [id number] Arrival date & time: 12/10/20  [id number]      History   Chief Complaint Chief Complaint  Patient presents with   Sore Throat   Headache    HPI Cheryl Dickerson is a 50 y.o. female.   50 year old female presents with fatigue and headache for the past 3 to 4 days. Also having slight runny nose and cough, nausea and soft stools. Yesterday developed a sore throat that has gotten worse today. Also having a low grade fever. No vomiting. Has taken Excedrin Migraine for headache with some relief and Aleve for throat pain with no relief. No known exposure to strep or COVID 19. Has been vaccinated and boosted against COVID 19. Other chronic health issues include type 2 DM and insomnia. Takes Glucophage daily and Ambien prn.   The history is provided by the patient.   Past Medical History:  Diagnosis Date   Diabetes mellitus without complication ([place])     There are no problems to display for this patient.   Past Surgical History:  Procedure Laterality Date   CESAREAN SECTION     CHOLECYSTECTOMY     EYE SURGERY     KNEE SURGERY      OB History     Gravida  2   Para  2   Term  2   Preterm  0   AB  0   Living         SAB  0   IAB  0   Ectopic  0   Multiple      Live Births               Home Medications    Prior to Admission medications   Medication Sig Start Date End Date Taking? Authorizing Provider  lidocaine (XYLOCAINE) 2 % solution Use as directed 10 mLs in the mouth or throat every 3 (three) hours as needed (sore throat). 12/10/20  Yes Jonelle Bann, Nicholes Stairs, NP  metFORMIN (GLUCOPHAGE) 500 MG tablet Take by mouth 2 (two) times daily with a meal.   Yes [provider]  zolpidem (AMBIEN CR) 12.5 MG CR tablet Take 12.5 mg by mouth at bedtime as needed for sleep.   Yes [provider]  HYDROcodone-acetaminophen (NORCO/VICODIN) 5-325 MG tablet Take 1 tablet by mouth every 4 (four) hours as needed. 06/14/18    Idol, Almyra Free, PA-C  ibuprofen (ADVIL,MOTRIN) 600 MG tablet Take 1 tablet (600 mg total) by mouth every 6 (six) hours as needed. 06/14/18   Evalee Jefferson, PA-C  metFORMIN (GLUCOPHAGE) 500 MG tablet Take 500 mg by mouth 2 (two) times daily with a meal.    [provider]    Family History Family History  Problem Relation Age of Onset   Cancer Mother    Heart attack Father    Diabetes Other     Social History Social History   Tobacco Use   Smoking status: Never   Smokeless tobacco: Never  Vaping Use   Vaping Use: Some days  Substance Use Topics   Alcohol use: Yes    Comment: rare   Drug use: No     Allergies   Patient has no known allergies.   Review of Systems Review of Systems  Constitutional:  Positive for activity change, appetite change, chills, fatigue and fever. Negative for diaphoresis.  HENT:  Positive for congestion, postnasal drip, rhinorrhea, sinus pressure and sore throat. Negative for ear discharge, ear pain,  facial swelling, nosebleeds and trouble swallowing.   Eyes:  Negative for pain, discharge, redness and itching.  Respiratory:  Positive for cough. Negative for chest tightness, shortness of breath and wheezing.   Gastrointestinal:  Positive for nausea. Negative for vomiting.  Musculoskeletal:  Negative for arthralgias, myalgias, neck pain and neck stiffness.  Skin:  Negative for color change and rash.  Allergic/Immunologic: Negative for environmental allergies, food allergies and immunocompromised state.  Neurological:  Positive for headaches. Negative for dizziness, tremors, seizures, syncope, facial asymmetry, weakness, light-headedness and numbness.  Hematological:  Negative for adenopathy. Does not bruise/bleed easily.  Psychiatric/Behavioral:  Positive for sleep disturbance.     Physical Exam Triage Vital Signs ED Triage Vitals  Enc Vitals Group     BP 12/10/20 1049 (!) 152/87     Pulse Rate 12/10/20 1049 97     Resp 12/10/20 1049 20      Temp 12/10/20 1049 99 F (37.2 C)     Temp src --      SpO2 12/10/20 1049 97 %     Weight --      Height --      Head Circumference --      Peak Flow --      Pain Score 12/10/20 1045 6     Pain Loc --      Pain Edu? --      Excl. in [place]? --    No data found.  Updated Vital Signs BP (!) 152/87   Pulse 97   Temp 99 F (37.2 C)   Resp 20   LMP 05/31/2018   SpO2 97%   Visual Acuity Right Eye Distance:   Left Eye Distance:   Bilateral Distance:    Right Eye Near:   Left Eye Near:    Bilateral Near:     Physical Exam Vitals and nursing note reviewed.  Constitutional:      General: She is awake. She is not in acute distress.    Appearance: She is well-developed and well-groomed. She is ill-appearing.     Comments: She is sitting on the exam table in no acute distress but appears tired and ill.   HENT:     Head: Normocephalic and atraumatic.     Right Ear: Hearing, tympanic membrane, ear canal and external ear normal.     Left Ear: Hearing, tympanic membrane, ear canal and external ear normal.     Nose: Rhinorrhea present. Rhinorrhea is clear.     Right Sinus: No maxillary sinus tenderness or frontal sinus tenderness.     Left Sinus: No maxillary sinus tenderness or frontal sinus tenderness.     Mouth/Throat:     Lips: Pink.     Mouth: Mucous membranes are moist.     Palate: Lesions present.     Pharynx: Uvula midline. Posterior oropharyngeal erythema present. No pharyngeal swelling, oropharyngeal exudate or uvula swelling.      Comments: One larger ulcer present on left upper posterior palate with surrounding erythema. No distinct exudate.  Eyes:     Extraocular Movements: Extraocular movements intact.     Conjunctiva/sclera: Conjunctivae normal.  Cardiovascular:     Rate and Rhythm: Normal rate and regular rhythm.     Heart sounds: Normal heart sounds. No murmur heard. Pulmonary:     Effort: Pulmonary effort is normal. No respiratory distress.     Breath sounds:  Normal breath sounds and air entry. No decreased air movement. No decreased breath sounds, wheezing, rhonchi or rales.  Musculoskeletal:        General: Normal range of motion.     Cervical back: Normal range of motion and neck supple.  Lymphadenopathy:     Cervical: No cervical adenopathy.  Skin:    General: Skin is warm and dry.     Capillary Refill: Capillary refill takes less than 2 seconds.     Findings: No rash.  Neurological:     General: No focal deficit present.     Mental Status: She is alert and oriented to person, place, and time.  Psychiatric:        Mood and Affect: Mood normal.        Behavior: Behavior normal. Behavior is cooperative.        Thought Content: Thought content normal.        Judgment: Judgment normal.     UC Treatments / Results  Labs (all labs ordered are listed, but only abnormal results are displayed) Labs Reviewed  NOVEL CORONAVIRUS, NAA  POCT RAPID STREP A (OFFICE)    EKG   Radiology No results found.  Procedures Procedures (including critical care time)  Medications Ordered in UC Medications - No data to display  Initial Impression / Assessment and Plan / UC Course  I have reviewed the triage vital signs and the nursing notes.  Pertinent labs & imaging results that were available during my care of the patient were reviewed by me and considered in my medical decision making (see chart for details).    Reviewed negative rapid strep test with patient. Discussed that it is typical to see ulcers in the pharynx with viral illnesses. Recommend trial Viscous lidocaine 2 teaspoons swish and spit out every 3 to 4 hours as needed. Continue Excedrin migraine as needed for headaches. Rest. Stay at home. Note written for work. Follow-up pending COVID 19 test results.   Final Clinical Impressions(s) / UC Diagnoses   Final diagnoses:  Ulcer of pharynx  Sore throat  Encounter for screening for COVID-19     Discharge Instructions       Recommend use Viscous lidocaine 2 teaspoons swish and spit out every 3 to 4 hours as needed for throat pain. May continue Excedrin migraine as directed. Rest. Stay at home. Follow-up pending COVID 19 test results.      ED Prescriptions     Medication Sig Dispense Auth. Provider   lidocaine (XYLOCAINE) 2 % solution Use as directed 10 mLs in the mouth or throat every 3 (three) hours as needed (sore throat). 100 mL Katy Apo, NP      PDMP not reviewed this encounter.   Katy Apo, NP 12/10/20 2045

## 2020-12-10 NOTE — ED Triage Notes (Signed)
Pt presents with nasal congestion and sore throat , fatigue and headache since Friday

## 2020-12-11 LAB — NOVEL CORONAVIRUS, NAA: SARS-CoV-2, NAA: NOT DETECTED

## 2020-12-11 LAB — SARS-COV-2, NAA 2 DAY TAT

## 2021-04-09 ENCOUNTER — Encounter: Payer: Self-pay | Admitting: Internal Medicine

## 2021-05-15 ENCOUNTER — Other Ambulatory Visit: Payer: Self-pay

## 2021-05-15 ENCOUNTER — Ambulatory Visit: Payer: 59 | Admitting: Internal Medicine

## 2021-05-15 ENCOUNTER — Encounter: Payer: Self-pay | Admitting: Internal Medicine

## 2021-05-15 ENCOUNTER — Telehealth: Payer: Self-pay

## 2021-05-15 DIAGNOSIS — K5900 Constipation, unspecified: Secondary | ICD-10-CM

## 2021-05-15 DIAGNOSIS — R1319 Other dysphagia: Secondary | ICD-10-CM

## 2021-05-15 DIAGNOSIS — Z1211 Encounter for screening for malignant neoplasm of colon: Secondary | ICD-10-CM

## 2021-05-15 DIAGNOSIS — K219 Gastro-esophageal reflux disease without esophagitis: Secondary | ICD-10-CM

## 2021-05-15 MED ORDER — CLENPIQ 10-3.5-12 MG-GM -GM/160ML PO SOLN: 1.0000 | Freq: Once | ORAL | 0 refills | Status: AC

## 2021-05-15 NOTE — Progress Notes (Signed)
Primary Care Physician:  Sharilyn Sites, MD Primary Gastroenterologist:  Dr. Abbey Chatters  Chief Complaint  Patient presents with   Dysphagia    HPI:   Cheryl Dickerson is a 51 y.o. female who presents to clinic today by referral from her PCP Dr. Hilma Favors for evaluation.  Patient states for approximately 2 months she has had new onset dysphagia.  Feels as though food and pills will get stuck in her substernal region.  Does not have any issues with clear liquids.  States when this happens she has to drink a lot of fluids for the material to go down her esophagus.  Occasional acid reflux.  No heartburn.  No epigastric or chest pain.  No melena hematochezia.  No unintentional weight loss.  Occasional abdominal pain related to mild constipation.  No previous upper endoscopy or colonoscopy.  Denies any family history of colorectal malignancy.  Past Medical History:  Diagnosis Date   Diabetes mellitus without complication ([place])     Past Surgical History:  Procedure Laterality Date   CESAREAN SECTION     CHOLECYSTECTOMY     EYE SURGERY     KNEE SURGERY      Current Outpatient Medications  Medication Sig Dispense Refill   Aspirin-Acetaminophen-Caffeine (EXCEDRIN MIGRAINE PO) Take by mouth. As needed     chlorpheniramine (CHLOR-TRIMETON) 4 MG tablet Take 4 mg by mouth daily.     metFORMIN (GLUCOPHAGE) 1000 MG tablet Take 1,000 mg by mouth 2 (two) times daily with a meal.     omeprazole (PRILOSEC) 40 MG capsule Take 40 mg by mouth in the morning and at bedtime.     No current facility-administered medications for this visit.    Allergies as of 05/15/2021   (No Known Allergies)    Family History  Problem Relation Age of Onset   Cancer Mother    Heart attack Father    Diabetes Other     Social History   Socioeconomic History   Marital status: Married    Spouse name: Not on file   Number of children: Not on file   Years of education: Not on file   Highest education level: Not on  file  Occupational History   Not on file  Tobacco Use   Smoking status: Every Day    Types: E-cigarettes   Smokeless tobacco: Never   Tobacco comments:    vapes  Vaping Use   Vaping Use: Some days  Substance and Sexual Activity   Alcohol use: Yes    Comment: rare   Drug use: No   Sexual activity: Not on file  Other Topics Concern   Not on file  Social History Narrative   ** Merged History Encounter **       Social Determinants of Health   Financial Resource Strain: Not on file  Food Insecurity: Not on file  Transportation Needs: Not on file  Physical Activity: Not on file  Stress: Not on file  Social Connections: Not on file  Intimate Partner Violence: Not on file    Subjective: Review of Systems  Constitutional:  Negative for chills and fever.  HENT:  Negative for congestion and hearing loss.   Eyes:  Negative for blurred vision and double vision.  Respiratory:  Negative for cough and shortness of breath.   Cardiovascular:  Negative for chest pain and palpitations.  Gastrointestinal:  Negative for abdominal pain, blood in stool, constipation, diarrhea, heartburn, melena and vomiting.  Genitourinary:  Negative for dysuria and urgency.  Musculoskeletal:  Negative for joint pain and myalgias.  Skin:  Negative for itching and rash.  Neurological:  Negative for dizziness and headaches.  Psychiatric/Behavioral:  Negative for depression. The patient is not nervous/anxious.       Objective: LMP 05/31/2018  Physical Exam Constitutional:      Appearance: Normal appearance.  HENT:     Head: Normocephalic and atraumatic.  Eyes:     Extraocular Movements: Extraocular movements intact.     Conjunctiva/sclera: Conjunctivae normal.  Cardiovascular:     Rate and Rhythm: Normal rate and regular rhythm.  Pulmonary:     Effort: Pulmonary effort is normal.     Breath sounds: Normal breath sounds.  Abdominal:     General: Bowel sounds are normal.     Palpations: Abdomen  is soft.  Musculoskeletal:        General: No swelling. Normal range of motion.     Cervical back: Normal range of motion and neck supple.  Skin:    General: Skin is warm and dry.     Coloration: Skin is not jaundiced.  Neurological:     General: No focal deficit present.     Mental Status: She is alert and oriented to person, place, and time.  Psychiatric:        Mood and Affect: Mood normal.        Behavior: Behavior normal.     Assessment: *GERD *Dysphagia-new, intermittent *Colon cancer screening *Constipation-mild  Plan: Will schedule for EGD with possible dilation to evaluate for peptic ulcer disease, esophagitis, gastritis, H. Pylori, duodenitis, or other. Will also evaluate for esophageal stricture, Schatzki's ring, esophageal web or other.   At the same time we will perform colonoscopy for colon cancer screening purposes.  The risks including infection, bleed, or perforation as well as benefits, limitations, alternatives and imponderables have been reviewed with the patient. Potential for esophageal dilation, biopsy, etc. have also been reviewed.  Questions have been answered. All parties agreeable.  Continue on omeprazole twice daily for now.  Patient to start fiber therapy for her mild constipation.  05/15/2021 8:51 AM   Disclaimer: This note was dictated with voice recognition software. Similar sounding words can inadvertently be transcribed and may not be corrected upon review.

## 2021-05-15 NOTE — Patient Instructions (Signed)
We will schedule you for upper endoscopy to further evaluate your difficulty swallowing. I may perform esophageal dilation depending on findings. At the same time we will perform colonoscopy for colon cancer screening purposes.  Continue on omeprazole twice daily.  It was very nice seeing both of you today.  Dr. Abbey Chatters  At [facility] we value your feedback. You may receive a survey about your visit today. Please share your experience as we strive to create trusting relationships with our patients to provide genuine, compassionate, quality care.  We appreciate your understanding and patience as we review any laboratory studies, imaging, and other diagnostic tests that are ordered as we care for you. Our office policy is 5 business days for review of these results, and any emergent or urgent results are addressed in a timely manner for your best interest. If you do not hear from our office in 1 week, please contact us.   We also encourage the use of MyChart, which contains your medical information for your review as well. If you are not enrolled in this feature, an access code is on this after visit summary for your convenience. Thank you for allowing [place] to be involved in your care.  It was great to see you today!  I hope you have a great rest of your Winter!    Cheryl Dickerson. Abbey Chatters, D.O. Gastroenterology and Hepatology [facility]

## 2021-05-15 NOTE — H&P (View-Only) (Signed)
Primary Care Physician:  Sharilyn Sites, MD Primary Gastroenterologist:  Dr. Abbey Chatters  Chief Complaint  Patient presents with   Dysphagia    HPI:   Cheryl Dickerson is a 51 y.o. female who presents to clinic today by referral from her PCP Dr. Hilma Favors for evaluation.  Patient states for approximately 2 months she has had new onset dysphagia.  Feels as though food and pills will get stuck in her substernal region.  Does not have any issues with clear liquids.  States when this happens she has to drink a lot of fluids for the material to go down her esophagus.  Occasional acid reflux.  No heartburn.  No epigastric or chest pain.  No melena hematochezia.  No unintentional weight loss.  Occasional abdominal pain related to mild constipation.  No previous upper endoscopy or colonoscopy.  Denies any family history of colorectal malignancy.  Past Medical History:  Diagnosis Date   Diabetes mellitus without complication ([place])     Past Surgical History:  Procedure Laterality Date   CESAREAN SECTION     CHOLECYSTECTOMY     EYE SURGERY     KNEE SURGERY      Current Outpatient Medications  Medication Sig Dispense Refill   Aspirin-Acetaminophen-Caffeine (EXCEDRIN MIGRAINE PO) Take by mouth. As needed     chlorpheniramine (CHLOR-TRIMETON) 4 MG tablet Take 4 mg by mouth daily.     metFORMIN (GLUCOPHAGE) 1000 MG tablet Take 1,000 mg by mouth 2 (two) times daily with a meal.     omeprazole (PRILOSEC) 40 MG capsule Take 40 mg by mouth in the morning and at bedtime.     No current facility-administered medications for this visit.    Allergies as of 05/15/2021   (No Known Allergies)    Family History  Problem Relation Age of Onset   Cancer Mother    Heart attack Father    Diabetes Other     Social History   Socioeconomic History   Marital status: Married    Spouse name: Not on file   Number of children: Not on file   Years of education: Not on file   Highest education level: Not on  file  Occupational History   Not on file  Tobacco Use   Smoking status: Every Day    Types: E-cigarettes   Smokeless tobacco: Never   Tobacco comments:    vapes  Vaping Use   Vaping Use: Some days  Substance and Sexual Activity   Alcohol use: Yes    Comment: rare   Drug use: No   Sexual activity: Not on file  Other Topics Concern   Not on file  Social History Narrative   ** Merged History Encounter **       Social Determinants of Health   Financial Resource Strain: Not on file  Food Insecurity: Not on file  Transportation Needs: Not on file  Physical Activity: Not on file  Stress: Not on file  Social Connections: Not on file  Intimate Partner Violence: Not on file    Subjective: Review of Systems  Constitutional:  Negative for chills and fever.  HENT:  Negative for congestion and hearing loss.   Eyes:  Negative for blurred vision and double vision.  Respiratory:  Negative for cough and shortness of breath.   Cardiovascular:  Negative for chest pain and palpitations.  Gastrointestinal:  Negative for abdominal pain, blood in stool, constipation, diarrhea, heartburn, melena and vomiting.  Genitourinary:  Negative for dysuria and urgency.  Musculoskeletal:  Negative for joint pain and myalgias.  Skin:  Negative for itching and rash.  Neurological:  Negative for dizziness and headaches.  Psychiatric/Behavioral:  Negative for depression. The patient is not nervous/anxious.       Objective: LMP 05/31/2018  Physical Exam Constitutional:      Appearance: Normal appearance.  HENT:     Head: Normocephalic and atraumatic.  Eyes:     Extraocular Movements: Extraocular movements intact.     Conjunctiva/sclera: Conjunctivae normal.  Cardiovascular:     Rate and Rhythm: Normal rate and regular rhythm.  Pulmonary:     Effort: Pulmonary effort is normal.     Breath sounds: Normal breath sounds.  Abdominal:     General: Bowel sounds are normal.     Palpations: Abdomen  is soft.  Musculoskeletal:        General: No swelling. Normal range of motion.     Cervical back: Normal range of motion and neck supple.  Skin:    General: Skin is warm and dry.     Coloration: Skin is not jaundiced.  Neurological:     General: No focal deficit present.     Mental Status: She is alert and oriented to person, place, and time.  Psychiatric:        Mood and Affect: Mood normal.        Behavior: Behavior normal.     Assessment: *GERD *Dysphagia-new, intermittent *Colon cancer screening *Constipation-mild  Plan: Will schedule for EGD with possible dilation to evaluate for peptic ulcer disease, esophagitis, gastritis, H. Pylori, duodenitis, or other. Will also evaluate for esophageal stricture, Schatzki's ring, esophageal web or other.   At the same time we will perform colonoscopy for colon cancer screening purposes.  The risks including infection, bleed, or perforation as well as benefits, limitations, alternatives and imponderables have been reviewed with the patient. Potential for esophageal dilation, biopsy, etc. have also been reviewed.  Questions have been answered. All parties agreeable.  Continue on omeprazole twice daily for now.  Patient to start fiber therapy for her mild constipation.  05/15/2021 8:51 AM   Disclaimer: This note was dictated with voice recognition software. Similar sounding words can inadvertently be transcribed and may not be corrected upon review.

## 2021-05-15 NOTE — Telephone Encounter (Signed)
PA for TCS/EGD/DIL submitted via [facility] website. PA# [id number], valid 06/04/21-09/02/21.

## 2021-06-04 ENCOUNTER — Ambulatory Visit (HOSPITAL_COMMUNITY): Payer: 59 | Admitting: Anesthesiology

## 2021-06-04 ENCOUNTER — Other Ambulatory Visit: Payer: Self-pay

## 2021-06-04 ENCOUNTER — Ambulatory Visit (HOSPITAL_COMMUNITY)
Admission: RE | Admit: 2021-06-04 | Discharge: 2021-06-04 | Disposition: A | Discharge status: Home / Self Care | Payer: 59 | Source: Ambulatory Visit | Attending: Internal Medicine | Admitting: Internal Medicine

## 2021-06-04 ENCOUNTER — Encounter (HOSPITAL_COMMUNITY)
Admission: RE | Disposition: A | Discharge status: Home / Self Care | Payer: Self-pay | Source: Ambulatory Visit | Attending: Internal Medicine

## 2021-06-04 ENCOUNTER — Ambulatory Visit (HOSPITAL_BASED_OUTPATIENT_CLINIC_OR_DEPARTMENT_OTHER): Payer: 59 | Admitting: Anesthesiology

## 2021-06-04 ENCOUNTER — Encounter (HOSPITAL_COMMUNITY): Payer: Self-pay

## 2021-06-04 DIAGNOSIS — K219 Gastro-esophageal reflux disease without esophagitis: Secondary | ICD-10-CM | POA: Insufficient documentation

## 2021-06-04 DIAGNOSIS — E119 Type 2 diabetes mellitus without complications: Secondary | ICD-10-CM | POA: Insufficient documentation

## 2021-06-04 DIAGNOSIS — K319 Disease of stomach and duodenum, unspecified: Secondary | ICD-10-CM | POA: Diagnosis not present

## 2021-06-04 DIAGNOSIS — Z7984 Long term (current) use of oral hypoglycemic drugs: Secondary | ICD-10-CM | POA: Insufficient documentation

## 2021-06-04 DIAGNOSIS — K222 Esophageal obstruction: Secondary | ICD-10-CM | POA: Insufficient documentation

## 2021-06-04 DIAGNOSIS — K2970 Gastritis, unspecified, without bleeding: Secondary | ICD-10-CM | POA: Insufficient documentation

## 2021-06-04 DIAGNOSIS — R1310 Dysphagia, unspecified: Secondary | ICD-10-CM | POA: Diagnosis not present

## 2021-06-04 DIAGNOSIS — Z79899 Other long term (current) drug therapy: Secondary | ICD-10-CM | POA: Diagnosis not present

## 2021-06-04 DIAGNOSIS — F17290 Nicotine dependence, other tobacco product, uncomplicated: Secondary | ICD-10-CM | POA: Insufficient documentation

## 2021-06-04 DIAGNOSIS — D123 Benign neoplasm of transverse colon: Secondary | ICD-10-CM | POA: Insufficient documentation

## 2021-06-04 DIAGNOSIS — K648 Other hemorrhoids: Secondary | ICD-10-CM | POA: Diagnosis not present

## 2021-06-04 DIAGNOSIS — Z1211 Encounter for screening for malignant neoplasm of colon: Secondary | ICD-10-CM | POA: Diagnosis not present

## 2021-06-04 DIAGNOSIS — K635 Polyp of colon: Secondary | ICD-10-CM

## 2021-06-04 HISTORY — PX: COLONOSCOPY WITH PROPOFOL: SHX5780

## 2021-06-04 HISTORY — PX: BALLOON DILATION: SHX5330

## 2021-06-04 HISTORY — PX: BIOPSY: SHX5522

## 2021-06-04 HISTORY — PX: ESOPHAGOGASTRODUODENOSCOPY (EGD) WITH PROPOFOL: SHX5813

## 2021-06-04 LAB — GLUCOSE, CAPILLARY: Glucose-Capillary: 183 mg/dL — ABNORMAL HIGH (ref 70–99)

## 2021-06-04 SURGERY — COLONOSCOPY WITH PROPOFOL
Anesthesia: General

## 2021-06-04 MED ORDER — PROPOFOL 500 MG/50ML IV EMUL
INTRAVENOUS | Status: DC | PRN
Start: 2021-06-04 — End: 2021-06-04
  Administered 2021-06-04: 150 ug/kg/min via INTRAVENOUS

## 2021-06-04 MED ORDER — OMEPRAZOLE 40 MG PO CPDR
40.0000 mg | DELAYED_RELEASE_CAPSULE | Freq: Two times a day (BID) | ORAL | 5 refills | Status: AC
Start: 2021-06-04 — End: 2023-09-04

## 2021-06-04 MED ORDER — LIDOCAINE HCL (CARDIAC) PF 100 MG/5ML IV SOSY
PREFILLED_SYRINGE | INTRAVENOUS | Status: DC | PRN
  Administered 2021-06-04: 50 mg via INTRAVENOUS

## 2021-06-04 MED ORDER — PROPOFOL 10 MG/ML IV BOLUS
INTRAVENOUS | Status: DC | PRN
  Administered 2021-06-04: 100 mg via INTRAVENOUS

## 2021-06-04 MED ORDER — PHENYLEPHRINE HCL (PRESSORS) 10 MG/ML IV SOLN
INTRAVENOUS | Status: DC | PRN
  Administered 2021-06-04 (×2): 80 ug via INTRAVENOUS

## 2021-06-04 MED ORDER — LACTATED RINGERS IV SOLN: INTRAVENOUS | Status: DC | PRN

## 2021-06-04 NOTE — Anesthesia Preprocedure Evaluation (Signed)
Anesthesia Evaluation  ?Patient identified by MRN, date of birth, ID band ?Patient awake ? ? ? ?Reviewed: ?Allergy & Precautions, H&P , NPO status , Patient's Chart, lab work & pertinent test results, reviewed documented beta blocker date and time  ? ?Airway ?Mallampati: II ? ?TM Distance: >3 FB ?Neck ROM: full ? ? ? Dental ?no notable dental hx. ? ?  ?Pulmonary ?neg pulmonary ROS, Current Smoker,  ?  ?Pulmonary exam normal ?breath sounds clear to auscultation ? ? ? ? ? ? Cardiovascular ?Exercise Tolerance: Good ?negative cardio ROS ? ? ?Rhythm:regular Rate:Normal ? ? ?  ?Neuro/Psych ?negative neurological ROS ? negative psych ROS  ? GI/Hepatic ?negative GI ROS, Neg liver ROS,   ?Endo/Other  ?negative endocrine ROSdiabetes, Type 2 ? Renal/GU ?negative Renal ROS  ?negative genitourinary ?  ?Musculoskeletal ? ? Abdominal ?  ?Peds ? Hematology ?negative hematology ROS ?(+)   ?Anesthesia Other Findings ? ? Reproductive/Obstetrics ?negative OB ROS ? ?  ? ? ? ? ? ? ? ? ? ? ? ? ? ?  ?  ? ? ? ? ? ? ? ? ?Anesthesia Physical ?Anesthesia Plan ? ?ASA: 2 ? ?Anesthesia Plan: General  ? ?Post-op Pain Management:   ? ?Induction:  ? ?PONV Risk Score and Plan: Propofol infusion ? ?Airway Management Planned:  ? ?Additional Equipment:  ? ?Intra-op Plan:  ? ?Post-operative Plan:  ? ?Informed Consent: I have reviewed the patients History and Physical, chart, labs and discussed the procedure including the risks, benefits and alternatives for the proposed anesthesia with the patient or authorized representative who has indicated his/her understanding and acceptance.  ? ? ? ?Dental Advisory Given ? ?Plan Discussed with: CRNA ? ?Anesthesia Plan Comments:   ? ? ? ? ? ? ?Anesthesia Quick Evaluation ? ?

## 2021-06-04 NOTE — Discharge Instructions (Addendum)
EGD ?Discharge instructions ?Please read the instructions outlined below and refer to this sheet in the next few weeks. These discharge instructions provide you with general information on caring for yourself after you leave the hospital. Your doctor may also give you specific instructions. While your treatment has been planned according to the most current medical practices available, unavoidable complications occasionally occur. If you have any problems or questions after discharge, please call your doctor. ?ACTIVITY ?You may resume your regular activity but move at a slower pace for the next 24 hours.  ?Take frequent rest periods for the next 24 hours.  ?Walking will help expel (get rid of) the air and reduce the bloated feeling in your abdomen.  ?No driving for 24 hours (because of the anesthesia (medicine) used during the test).  ?You may shower.  ?Do not sign any important legal documents or operate any machinery for 24 hours (because of the anesthesia used during the test).  ?NUTRITION ?Drink plenty of fluids.  ?You may resume your normal diet.  ?Begin with a light meal and progress to your normal diet.  ?Avoid alcoholic beverages for 24 hours or as instructed by your caregiver.  ?MEDICATIONS ?You may resume your normal medications unless your caregiver tells you otherwise.  ?WHAT YOU CAN EXPECT TODAY ?You may experience abdominal discomfort such as a feeling of fullness or ?gas? pains.  ?FOLLOW-UP ?Your doctor will discuss the results of your test with you.  ?SEEK IMMEDIATE MEDICAL ATTENTION IF ANY OF THE FOLLOWING OCCUR: ?Excessive nausea (feeling sick to your stomach) and/or vomiting.  ?Severe abdominal pain and distention (swelling).  ?Trouble swallowing.  ?Temperature over 101? F (37.8? C).  ?Rectal bleeding or vomiting of blood.  ? ? ? ?Colonoscopy ?Discharge Instructions ? ?Read the instructions outlined below and refer to this sheet in the next few weeks. These discharge instructions provide you with  general information on caring for yourself after you leave the hospital. Your doctor may also give you specific instructions. While your treatment has been planned according to the most current medical practices available, unavoidable complications occasionally occur.  ? ?ACTIVITY ?You may resume your regular activity, but move at a slower pace for the next 24 hours.  ?Take frequent rest periods for the next 24 hours.  ?Walking will help get rid of the air and reduce the bloated feeling in your belly (abdomen).  ?No driving for 24 hours (because of the medicine (anesthesia) used during the test).   ?Do not sign any important legal documents or operate any machinery for 24 hours (because of the anesthesia used during the test).  ?NUTRITION ?Drink plenty of fluids.  ?You may resume your normal diet as instructed by your doctor.  ?Begin with a light meal and progress to your normal diet. Heavy or fried foods are harder to digest and may make you feel sick to your stomach (nauseated).  ?Avoid alcoholic beverages for 24 hours or as instructed.  ?MEDICATIONS ?You may resume your normal medications unless your doctor tells you otherwise.  ?WHAT YOU CAN EXPECT TODAY ?Some feelings of bloating in the abdomen.  ?Passage of more gas than usual.  ?Spotting of blood in your stool or on the toilet paper.  ?IF YOU HAD POLYPS REMOVED DURING THE COLONOSCOPY: ?No aspirin products for 7 days or as instructed.  ?No alcohol for 7 days or as instructed.  ?Eat a soft diet for the next 24 hours.  ?FINDING OUT THE RESULTS OF YOUR TEST ?Not all test results are  available during your visit. If your test results are not back during the visit, make an appointment with your caregiver to find out the results. Do not assume everything is normal if you have not heard from your caregiver or the medical facility. It is important for you to follow up on all of your test results.  ?SEEK IMMEDIATE MEDICAL ATTENTION IF: ?You have more than a spotting of  blood in your stool.  ?Your belly is swollen (abdominal distention).  ?You are nauseated or vomiting.  ?You have a temperature over 101.  ?You have abdominal pain or discomfort that is severe or gets worse throughout the day.  ? ?Your EGD revealed mild amount inflammation in your stomach.  I took biopsies of this to rule out infection with a bacteria called H. pylori.  Await pathology results, my office will contact you.  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  Hopefully this helps with your swallowing.  Continue on omeprazole.  I want you to take this twice daily instead of both pills once daily. ? ?Your colonoscopy revealed 1 polyp(s) which I removed successfully. Await pathology results, my office will contact you. I recommend repeating colonoscopy in 5-10 years for surveillance purposes depending on what type of polyp it is.  ? ?Follow-up with GI in 6 months. Office will call you to schedule an appointment.  ? ? ? ?I hope you have a great rest of your week! ? ?Elon Alas. Abbey Chatters, D.O. ?Gastroenterology and Hepatology ?[facility] ? ?

## 2021-06-04 NOTE — Op Note (Signed)
[facility] ?Patient Name: Cheryl Dickerson ?Procedure Date: 06/04/2021 9:01 AM ?MRN: [id number] ?Date of Birth: 09-26-1970 ?Attending MD: Elon Alas. Abbey Chatters , DO ?CSN: [id number] ?Age: 51 ?Admit Type: Outpatient ?Procedure:                Colonoscopy ?Indications:              Screening for colorectal malignant neoplasm ?Providers:                Elon Alas. Abbey Chatters, DO, Janeece Riggers, RN, Crisann  ?                          Wynonia Lawman, Technician ?Referring MD:              ?Medicines:                See the Anesthesia note for documentation of the  ?                          administered medications ?Complications:            No immediate complications. ?Estimated Blood Loss:     Estimated blood loss was minimal. ?Procedure:                Pre-Anesthesia Assessment: ?                          - The anesthesia plan was to use monitored  ?                          anesthesia care (MAC). ?                          After obtaining informed consent, the colonoscope  ?                          was passed under direct vision. Throughout the  ?                          procedure, the patient's blood pressure, pulse, and  ?                          oxygen saturations were monitored continuously. The  ?                          PCF-HQ190L ([id number]) scope was introduced through  ?                          the anus and advanced to the the cecum, identified  ?                          by appendiceal orifice and ileocecal valve. The  ?                          colonoscopy was performed without difficulty. The  ?                          patient tolerated the procedure well. The  quality  ?                          of the bowel preparation was evaluated using the  ?                          BBPS [facility] Bowel Preparation Scale) with scores  ?                          of: Right Colon = 3, Transverse Colon = 3 and Left  ?                          Colon = 3 (entire mucosa seen well with no residual  ?                          staining,  small fragments of stool or opaque  ?                          liquid). The total BBPS score equals 9. ?Scope In: 9:02:32 AM ?Scope Out: 9:10:51 AM ?Scope Withdrawal Time: 0 hours 6 minutes 46 seconds  ?Total Procedure Duration: 0 hours 8 minutes 19 seconds  ?Findings: ?     The perianal and digital rectal examinations were normal. ?     Non-bleeding internal hemorrhoids were found during endoscopy. ?     A 2 mm polyp was found in the transverse colon. The polyp was sessile.  ?     The polyp was removed with a cold biopsy forceps. Resection and  ?     retrieval were complete. ?     The exam was otherwise without abnormality on direct and retroflexion  ?     views. ?Impression:               - Non-bleeding internal hemorrhoids. ?                          - One 2 mm polyp in the transverse colon, removed  ?                          with a cold biopsy forceps. Resected and retrieved. ?                          - The examination was otherwise normal on direct  ?                          and retroflexion views. ?Moderate Sedation: ?     Per Anesthesia Care ?Recommendation:           - Patient has a contact number available for  ?                          emergencies. The signs and symptoms of potential  ?                          delayed complications were discussed with the  ?  patient. Return to normal activities tomorrow.  ?                          Written discharge instructions were provided to the  ?                          patient. ?                          - Resume previous diet. ?                          - Continue present medications. ?                          - Await pathology results. ?                          - Repeat colonoscopy in 5 years for surveillance. ?Procedure Code(s):        --- Professional --- ?                          [id number], Colonoscopy, flexible; with biopsy, single  ?                          or multiple ?Diagnosis Code(s):        --- Professional --- ?                           Z12.11, Encounter for screening for malignant  ?                          neoplasm of colon ?                          K63.5, Polyp of colon ?                          K64.8, Other hemorrhoids ?CPT copyright 2019 American [employer] Association. All rights reserved. ?The codes documented in this report are preliminary and upon coder review may  ?be revised to meet current compliance requirements. ?Elon Alas. Abbey Chatters, DO ?Elon Alas. [place], DO ?06/04/2021 9:13:35 AM ?This report has been signed electronically. ?Number of Addenda: 0 ?

## 2021-06-04 NOTE — Op Note (Signed)
[facility] ?Patient Name: Cheryl Dickerson ?Procedure Date: 06/04/2021 7:55 AM ?MRN: [id number] ?Date of Birth: 07-Sep-1970 ?Attending MD: Elon Alas. Abbey Chatters , DO ?CSN: [id number] ?Age: 51 ?Admit Type: Outpatient ?Procedure:                Upper GI endoscopy ?Indications:              Dysphagia, Heartburn ?Providers:                Elon Alas. Abbey Chatters, DO, Janeece Riggers, RN, Crisann  ?                          Wynonia Lawman, Technician ?Referring MD:              ?Medicines:                See the Anesthesia note for documentation of the  ?                          administered medications ?Complications:            No immediate complications. ?Estimated Blood Loss:     Estimated blood loss was minimal. ?Procedure:                Pre-Anesthesia Assessment: ?                          - The anesthesia plan was to use monitored  ?                          anesthesia care (MAC). ?                          After obtaining informed consent, the endoscope was  ?                          passed under direct vision. Throughout the  ?                          procedure, the patient's blood pressure, pulse, and  ?                          oxygen saturations were monitored continuously. The  ?                          GIF-H190 ([id number]) scope was introduced through the  ?                          mouth, and advanced to the second part of duodenum.  ?                          The upper GI endoscopy was accomplished without  ?                          difficulty. The patient tolerated the procedure  ?                          well. ?Scope In: 8:54:08 AM ?Scope  Out: 8:59:10 AM ?Total Procedure Duration: 0 hours 5 minutes 2 seconds  ?Findings: ?     The Z-line was regular and was found 36 cm from the incisors. ?     One benign-appearing, intrinsic mild stenosis was found in the distal  ?     esophagus. The stenosis was traversed. A TTS dilator was passed through  ?     the scope. Dilation with an 18-19-20 mm balloon dilator was performed to  ?      20 mm. The dilation site was examined and showed mild mucosal disruption  ?     and moderate improvement in luminal narrowing. ?     Mild inflammation characterized by erythema and linear erosions was  ?     found in the gastric body and in the gastric antrum. Biopsies were taken  ?     with a cold forceps for Helicobacter pylori testing. ?     The duodenal bulb, first portion of the duodenum and second portion of  ?     the duodenum were normal. ?Impression:               - Z-line regular, 36 cm from the incisors. ?                          - Benign-appearing esophageal stenosis. Dilated. ?                          - Gastritis. Biopsied. ?                          - Normal duodenal bulb, first portion of the  ?                          duodenum and second portion of the duodenum. ?Moderate Sedation: ?     Per Anesthesia Care ?Recommendation:           - Patient has a contact number available for  ?                          emergencies. The signs and symptoms of potential  ?                          delayed complications were discussed with the  ?                          patient. Return to normal activities tomorrow.  ?                          Written discharge instructions were provided to the  ?                          patient. ?                          - Resume previous diet. ?                          - Continue present medications. ?                          -  Await pathology results. ?                          - Repeat upper endoscopy PRN for retreatment. ?                          - Return to GI clinic in 6 months. ?Procedure Code(s):        --- Professional --- ?                          [phone], Esophagogastroduodenoscopy, flexible,  ?                          transoral; with transendoscopic balloon dilation of  ?                          esophagus (less than 30 mm diameter) ?                          43239, 59, Esophagogastroduodenoscopy, flexible,  ?                          transoral; with biopsy,  single or multiple ?Diagnosis Code(s):        --- Professional --- ?                          K22.2, Esophageal obstruction ?                          K29.70, Gastritis, unspecified, without bleeding ?                          R13.10, Dysphagia, unspecified ?                          R12, Heartburn ?CPT copyright 2019 American Medical Association. All rights reserved. ?The codes documented in this report are preliminary and upon coder review may  ?be revised to meet current compliance requirements. ?Elon Alas. Abbey Chatters, DO ?Elon Alas. [place], DO ?06/04/2021 9:01:28 AM ?This report has been signed electronically. ?Number of Addenda: 0 ?

## 2021-06-04 NOTE — Interval H&P Note (Signed)
History and Physical Interval Note: ? ?06/04/2021 ?8:45 AM ? ?Cheryl Dickerson  has presented today for surgery, with the diagnosis of screening colonoscopy, GERD, dysphagia.  The various methods of treatment have been discussed with the patient and family. After consideration of risks, benefits and other options for treatment, the patient has consented to  Procedure(s) with comments: ?COLONOSCOPY WITH PROPOFOL (N/A) - 9:45am ?ESOPHAGOGASTRODUODENOSCOPY (EGD) WITH PROPOFOL (N/A) ?BALLOON DILATION (N/A) as a surgical intervention.  The patient's history has been reviewed, patient examined, no change in status, stable for surgery.  I have reviewed the patient's chart and labs.  Questions were answered to the patient's satisfaction.   ? ? ?Eloise Harman ? ? ?

## 2021-06-04 NOTE — Transfer of Care (Signed)
Immediate Anesthesia Transfer of Care Note ? ?Patient: Cheryl Dickerson ? ?Procedure(s) Performed: COLONOSCOPY WITH PROPOFOL ?ESOPHAGOGASTRODUODENOSCOPY (EGD) WITH PROPOFOL ?BALLOON DILATION ?BIOPSY ? ?Patient Location: Endoscopy Unit ? ?Anesthesia Type:General ? ?Level of Consciousness: awake ? ?Airway & Oxygen Therapy: Patient Spontanous Breathing ? ?Post-op Assessment: Report given to RN and Post -op Vital signs reviewed and stable ? ?Post vital signs: Reviewed and stable ? ?Last Vitals:  ?Vitals Value Taken Time  ?BP    ?Temp    ?Pulse 76   ?Resp 12   ?SpO2 97%   ? ? ?Last Pain:  ?Vitals:  ? 06/04/21 0746  ?TempSrc: Oral  ?PainSc:   ?   ? ?Patients Stated Pain Goal: 7 (06/04/21 0745) ? ?Complications: No notable events documented. ?

## 2021-06-05 LAB — SURGICAL PATHOLOGY

## 2021-06-05 NOTE — Anesthesia Postprocedure Evaluation (Signed)
Anesthesia Post Note ? ?Patient: Cheryl Dickerson ? ?Procedure(s) Performed: COLONOSCOPY WITH PROPOFOL ?ESOPHAGOGASTRODUODENOSCOPY (EGD) WITH PROPOFOL ?BALLOON DILATION ?BIOPSY ? ?Patient location during evaluation: Phase II ?Anesthesia Type: General ?Level of consciousness: awake ?Pain management: pain level controlled ?Vital Signs Assessment: post-procedure vital signs reviewed and stable ?Respiratory status: spontaneous breathing and respiratory function stable ?Cardiovascular status: blood pressure returned to baseline and stable ?Postop Assessment: no headache and no apparent nausea or vomiting ?Anesthetic complications: no ?Comments: Late entry ? ? ?No notable events documented. ? ? ?Last Vitals:  ?Vitals:  ? 06/04/21 0914 06/04/21 0919  ?BP: (!) 85/53 93/70  ?Pulse: 76   ?Resp: 14   ?Temp: 36.4 ?C   ?SpO2: 97%   ?  ?Last Pain:  ?Vitals:  ? 06/04/21 0914  ?TempSrc: Oral  ?PainSc: 0-No pain  ? ? ?  ?  ?  ?  ?  ?  ? ?Louann Sjogren ? ? ? ? ?

## 2021-06-07 ENCOUNTER — Encounter (HOSPITAL_COMMUNITY): Payer: Self-pay | Admitting: Internal Medicine

## 2021-06-22 ENCOUNTER — Ambulatory Visit
Admission: EM | Admit: 2021-06-22 | Discharge: 2021-06-22 | Disposition: A | Discharge status: Home / Self Care | Payer: 59 | Attending: Student | Admitting: Student

## 2021-06-22 ENCOUNTER — Encounter: Payer: Self-pay | Admitting: Emergency Medicine

## 2021-06-22 ENCOUNTER — Other Ambulatory Visit: Payer: Self-pay

## 2021-06-22 DIAGNOSIS — S161XXA Strain of muscle, fascia and tendon at neck level, initial encounter: Secondary | ICD-10-CM | POA: Diagnosis not present

## 2021-06-22 DIAGNOSIS — M5412 Radiculopathy, cervical region: Secondary | ICD-10-CM | POA: Diagnosis not present

## 2021-06-22 MED ORDER — CYCLOBENZAPRINE HCL 10 MG PO TABS: 10.0000 mg | ORAL_TABLET | Freq: Two times a day (BID) | ORAL | 0 refills | Status: DC | PRN

## 2021-06-22 MED ORDER — METHYLPREDNISOLONE SODIUM SUCC 125 MG IJ SOLR
60.0000 mg | Freq: Once | INTRAMUSCULAR | Status: AC
  Administered 2021-06-22: 60 mg via INTRAMUSCULAR

## 2021-06-22 NOTE — Discharge Instructions (Addendum)
-  Flexeril for muscular pain.  This medication can cause drowsiness, so take at night or when you are home.  Do not take before driving. ?-You can take Tylenol up to 1000 mg 3 times daily, and ibuprofen up to 600 mg 3 times daily with food.  You can take these together, or alternate every 3-4 hours. ?-Heating pad, gentle stretching, rest.  ?

## 2021-06-22 NOTE — ED Triage Notes (Signed)
Pt reports neck pain since Thursday that radiates to right shoulder and right arm. Pt denies any known injury. ?

## 2021-06-22 NOTE — ED Provider Notes (Signed)
?[facility] ? ? ? ?CSN: [id number] ?Arrival date & time: 06/22/21  1304 ? ? ?  ? ?History   ?Chief Complaint ?Chief Complaint  ?Patient presents with  ? Neck Pain  ? ? ?HPI ?Cheryl Dickerson is a 51 y.o. female presenting with neck pain with radiation to the right shoulder and arm for 3 days.  History noncontributory, denies prior injury to the arm.  Denies acute injury or overuse.  She is right-handed.  Describes pain over the right side of the back of her neck, right posterior shoulder radiating to the scapula, and pain shooting down the arm like it is falling asleep.  Has attempted Aleve, heating pad, ice without improvement. ? ?HPI ? ?Past Medical History:  ?Diagnosis Date  ? Diabetes mellitus without complication ([place])   ? ? ?There are no problems to display for this patient. ? ? ?Past Surgical History:  ?Procedure Laterality Date  ? BALLOON DILATION N/A 06/04/2021  ? Procedure: BALLOON DILATION;  Surgeon: Eloise Harman, DO;  [facility];  Service: Endoscopy;  Laterality: N/A;  ? BIOPSY  06/04/2021  ? Procedure: BIOPSY;  Surgeon: Eloise Harman, DO;  [facility];  Service: Endoscopy;;  ? CESAREAN SECTION    ? CHOLECYSTECTOMY    ? COLONOSCOPY WITH PROPOFOL N/A 06/04/2021  ? Procedure: COLONOSCOPY WITH PROPOFOL;  Surgeon: Eloise Harman, DO;  [facility];  Service: Endoscopy;  Laterality: N/A;  9:45am  ? ESOPHAGOGASTRODUODENOSCOPY (EGD) WITH PROPOFOL N/A 06/04/2021  ? Procedure: ESOPHAGOGASTRODUODENOSCOPY (EGD) WITH PROPOFOL;  Surgeon: Eloise Harman, DO;  [facility];  Service: Endoscopy;  Laterality: N/A;  ? EYE SURGERY    ? KNEE SURGERY    ? ? ?OB History   ? ? Gravida  ?2  ? Para  ?2  ? Term  ?2  ? Preterm  ?0  ? AB  ?0  ? Living  ?   ?  ? ? SAB  ?0  ? IAB  ?0  ? Ectopic  ?0  ? Multiple  ?   ? Live Births  ?   ?   ?  ?  ? ? ? ?Home Medications   ? ?Prior to Admission medications   ?Medication Sig Start Date End Date Taking? Authorizing Provider   ?cyclobenzaprine (FLEXERIL) 10 MG tablet Take 1 tablet (10 mg total) by mouth 2 (two) times daily as needed for muscle spasms. 06/22/21  Yes Hazel Sams, PA-C  ?aspirin-acetaminophen-caffeine (EXCEDRIN MIGRAINE) [phone] MG tablet Take 1 tablet by mouth daily as needed (headache).    [provider]  ?chlorpheniramine (CHLOR-TRIMETON) 4 MG tablet Take 4 mg by mouth daily as needed for allergies.    [provider]  ?metFORMIN (GLUCOPHAGE) 1000 MG tablet Take 1,000 mg by mouth 2 (two) times daily with a meal.    [provider]  ?omeprazole (PRILOSEC) 40 MG capsule Take 1 capsule (40 mg total) by mouth 2 (two) times daily before a meal. 06/04/21 12/01/21  Eloise Harman, DO  ? ? ?Family History ?Family History  ?Problem Relation Age of Onset  ? Cancer Mother   ? Heart attack Father   ? Diabetes Other   ? ? ?Social History ?Social History  ? ?Tobacco Use  ? Smoking status: Every Day  ?  Types: E-cigarettes  ? Smokeless tobacco: Never  ? Tobacco comments:  ?  vapes  ?Vaping Use  ? Vaping Use: Some days  ?Substance Use Topics  ?  Alcohol use: Yes  ?  Comment: rare  ? Drug use: No  ? ? ? ?Allergies   ?Patient has no known allergies. ? ? ?Review of Systems ?Review of Systems  ?Musculoskeletal:   ?     R shoulder pain   ?All other systems reviewed and are negative. ? ? ?Physical Exam ?Triage Vital Signs ?ED Triage Vitals [06/22/21 1352]  ?Enc Vitals Group  ?   BP 137/89  ?   Pulse Rate 95  ?   Resp 18  ?   Temp 97.9 ?F (36.6 ?C)  ?   Temp Source Oral  ?   SpO2 95 %  ?   Weight 138 lb (62.6 kg)  ?   Height 5' (1.524 m)  ?   Head Circumference   ?   Peak Flow   ?   Pain Score 6  ?   Pain Loc   ?   Pain Edu?   ?   Excl. in [place]?   ? ?No data found. ? ?Updated Vital Signs ?BP 137/89 (BP Location: Right Arm)   Pulse 95   Temp 97.9 ?F (36.6 ?C) (Oral)   Resp 18   Ht 5' (1.524 m)   Wt 138 lb (62.6 kg)   LMP 05/31/2018   SpO2 95%   BMI 26.95 kg/m?  ? ?Visual Acuity ?Right Eye Distance:   ?Left  Eye Distance:   ?Bilateral Distance:   ? ?Right Eye Near:   ?Left Eye Near:    ?Bilateral Near:    ? ?Physical Exam ?Vitals reviewed.  ?Constitutional:   ?   General: She is not in acute distress. ?   Appearance: Normal appearance. She is not ill-appearing.  ?HENT:  ?   Head: Normocephalic and atraumatic.  ?Cardiovascular:  ?   Rate and Rhythm: Normal rate and regular rhythm.  ?   Heart sounds: Normal heart sounds.  ?Pulmonary:  ?   Effort: Pulmonary effort is normal.  ?   Breath sounds: Normal breath sounds and air entry.  ?Abdominal:  ?   Tenderness: There is no abdominal tenderness. There is no right CVA tenderness, left CVA tenderness, guarding or rebound.  ?Musculoskeletal:  ?   Cervical back: Normal range of motion. Spasms and tenderness present. No swelling, deformity, signs of trauma, rigidity, bony tenderness or crepitus. No pain with movement.  ?   Thoracic back: Spasms and tenderness present. No swelling, deformity, signs of trauma or bony tenderness. Normal range of motion. No scoliosis.  ?   Lumbar back: No swelling, deformity, signs of trauma, spasms, tenderness or bony tenderness. Normal range of motion. Negative right straight leg raise test and negative left straight leg raise test. No scoliosis.  ?   Comments: Right-sided cervical paraspinous muscle tenderness to palpation, pain elicited with flexion and extension cervical spine.  Right proximal trapezius tenderness to palpation, pain elicited with abduction right arm.  Hypertonicity of thoracic paraspinous muscles with tenderness to palpation.  Positive right Spurling.  Strength and sensation grossly intact upper extremities, grip strength 5/5. No midline spinous tenderness, deformity, stepoff. ? ?Absolutely no other injury, deformity, tenderness, ecchymosis, abrasion.  ?Neurological:  ?   General: No focal deficit present.  ?   Mental Status: She is alert.  ?   Cranial Nerves: No cranial nerve deficit.  ?Psychiatric:     ?   Mood and Affect: Mood  normal.     ?   Behavior: Behavior normal.     ?  Thought Content: Thought content normal.     ?   Judgment: Judgment normal.  ? ? ? ?UC Treatments / Results  ?Labs ?(all labs ordered are listed, but only abnormal results are displayed) ?Labs Reviewed - No data to display ? ?EKG ? ? ?Radiology ?No results found. ? ?Procedures ?Procedures (including critical care time) ? ?Medications Ordered in UC ?Medications  ?methylPREDNISolone sodium succinate (SOLU-MEDROL) 125 mg/2 mL injection 60 mg (60 mg Intramuscular Given 06/22/21 1429)  ? ? ?Initial Impression / Assessment and Plan / UC Course  ?I have reviewed the triage vital signs and the nursing notes. ? ?Pertinent labs & imaging results that were available during my care of the patient were reviewed by me and considered in my medical decision making (see chart for details). ? ?  ? ?This patient is a very pleasant 51 y.o. year old female presenting with trapezius strain and cervical radiculitis.  No red flag symptoms.  IM Solu-Medrol administered today.  Flexeril sent, which she has tolerated in the past.  Continue heating pad, Aleve. ED return precautions discussed. Patient verbalizes understanding and agreement.  ?.  ? ?Final Clinical Impressions(s) / UC Diagnoses  ? ?Final diagnoses:  ?Strain of cervical portion of right trapezius muscle  ?Cervical radiculitis  ? ? ? ?Discharge Instructions   ? ?  ?-Flexeril for muscular pain.  This medication can cause drowsiness, so take at night or when you are home.  Do not take before driving. ?-You can take Tylenol up to 1000 mg 3 times daily, and ibuprofen up to 600 mg 3 times daily with food.  You can take these together, or alternate every 3-4 hours. ?-Heating pad, gentle stretching, rest.  ? ? ?ED Prescriptions   ? ? Medication Sig Dispense Auth. Provider  ? cyclobenzaprine (FLEXERIL) 10 MG tablet Take 1 tablet (10 mg total) by mouth 2 (two) times daily as needed for muscle spasms. 20 tablet Hazel Sams, PA-C  ? ?   ? ?PDMP not reviewed this encounter. ?  ?Hazel Sams, PA-C ?06/22/21 1438 ? ?

## 2021-08-04 ENCOUNTER — Ambulatory Visit
Admission: RE | Admit: 2021-08-04 | Discharge: 2021-08-04 | Disposition: A | Discharge status: Home / Self Care | Payer: 59 | Source: Ambulatory Visit | Attending: Student | Admitting: Student

## 2021-08-04 VITALS — BP 103/74 | HR 108 | Temp 97.8°F | Resp 18

## 2021-08-04 DIAGNOSIS — E860 Dehydration: Secondary | ICD-10-CM | POA: Diagnosis not present

## 2021-08-04 DIAGNOSIS — H8110 Benign paroxysmal vertigo, unspecified ear: Secondary | ICD-10-CM | POA: Diagnosis not present

## 2021-08-04 MED ORDER — ONDANSETRON 4 MG PO TBDP: 4.0000 mg | ORAL_TABLET | Freq: Three times a day (TID) | ORAL | 0 refills | Status: DC | PRN

## 2021-08-04 MED ORDER — MECLIZINE HCL 12.5 MG PO TABS: 12.5000 mg | ORAL_TABLET | Freq: Three times a day (TID) | ORAL | 0 refills | Status: AC | PRN

## 2021-08-04 MED ORDER — ONDANSETRON 4 MG PO TBDP
4.0000 mg | ORAL_TABLET | Freq: Once | ORAL | Status: AC
  Administered 2021-08-04: 4 mg via ORAL

## 2021-08-04 NOTE — ED Triage Notes (Signed)
Pt presents with c/o dizziness that began on Friday, then developed headache and pain reported behind eyes ?

## 2021-08-04 NOTE — ED Provider Notes (Signed)
?[facility] ? ? ? ?CSN: [id number] ?Arrival date & time: 08/04/21  1338 ? ? ?  ? ?History   ?Chief Complaint ?Chief Complaint  ?Patient presents with  ? Dizziness  ?  Dizziness, headache, stuffy head, cold/hot - Entered by patient  ? ? ?HPI ?Cheryl Dickerson is a 51 y.o. female presenting with dizziness for 3 days.  History diabetes that is controlled on metformin.  Describes sensation of room spinning, nausea without vomiting, occasional nonproductive cough, pressure behind eyes, 2/10 throbbing frontal headache.  States she tolerates fluid and food, but has not been eating or drinking due to decreased appetite and nausea.  States the dizziness is worse with ambulation and standing, it seems to be positional in nature.  The headache was relieved by Excedrin, but did not fully go away. Denies worst headache of life, thunderclap headache, weakness/sensation changes in arms/legs, vision changes, shortness of breath, chest pain/pressure, photophobia, phonophobia, n/v/d.  Denies SOB, CP, weakness, vision changes.  ? ?HPI ? ?Past Medical History:  ?Diagnosis Date  ? Diabetes mellitus without complication ([place])   ? ? ?There are no problems to display for this patient. ? ? ?Past Surgical History:  ?Procedure Laterality Date  ? BALLOON DILATION N/A 06/04/2021  ? Procedure: BALLOON DILATION;  Surgeon: Eloise Harman, DO;  [facility];  Service: Endoscopy;  Laterality: N/A;  ? BIOPSY  06/04/2021  ? Procedure: BIOPSY;  Surgeon: Eloise Harman, DO;  [facility];  Service: Endoscopy;;  ? CESAREAN SECTION    ? CHOLECYSTECTOMY    ? COLONOSCOPY WITH PROPOFOL N/A 06/04/2021  ? Procedure: COLONOSCOPY WITH PROPOFOL;  Surgeon: Eloise Harman, DO;  [facility];  Service: Endoscopy;  Laterality: N/A;  9:45am  ? ESOPHAGOGASTRODUODENOSCOPY (EGD) WITH PROPOFOL N/A 06/04/2021  ? Procedure: ESOPHAGOGASTRODUODENOSCOPY (EGD) WITH PROPOFOL;  Surgeon: Eloise Harman, DO;  [facility];   Service: Endoscopy;  Laterality: N/A;  ? EYE SURGERY    ? KNEE SURGERY    ? ? ?OB History   ? ? Gravida  ?2  ? Para  ?2  ? Term  ?2  ? Preterm  ?0  ? AB  ?0  ? Living  ?   ?  ? ? SAB  ?0  ? IAB  ?0  ? Ectopic  ?0  ? Multiple  ?   ? Live Births  ?   ?   ?  ?  ? ? ? ?Home Medications   ? ?Prior to Admission medications   ?Medication Sig Start Date End Date Taking? Authorizing Provider  ?meclizine (ANTIVERT) 12.5 MG tablet Take 1 tablet (12.5 mg total) by mouth 3 (three) times daily as needed for dizziness. 08/04/21  Yes Hazel Sams, PA-C  ?ondansetron (ZOFRAN-ODT) 4 MG disintegrating tablet Take 1 tablet (4 mg total) by mouth every 8 (eight) hours as needed for nausea or vomiting. 08/04/21  Yes Hazel Sams, PA-C  ?aspirin-acetaminophen-caffeine (EXCEDRIN MIGRAINE) [phone] MG tablet Take 1 tablet by mouth daily as needed (headache).    [provider]  ?chlorpheniramine (CHLOR-TRIMETON) 4 MG tablet Take 4 mg by mouth daily as needed for allergies.    [provider]  ?cyclobenzaprine (FLEXERIL) 10 MG tablet Take 1 tablet (10 mg total) by mouth 2 (two) times daily as needed for muscle spasms. 06/22/21   Hazel Sams, PA-C  ?metFORMIN (GLUCOPHAGE) 1000 MG tablet Take 1,000 mg by mouth 2 (two) times daily with a meal.  [provider]  ?omeprazole (PRILOSEC) 40 MG capsule Take 1 capsule (40 mg total) by mouth 2 (two) times daily before a meal. 06/04/21 12/01/21  Eloise Harman, DO  ? ? ?Family History ?Family History  ?Problem Relation Age of Onset  ? Cancer Mother   ? Heart attack Father   ? Diabetes Other   ? ? ?Social History ?Social History  ? ?Tobacco Use  ? Smoking status: Every Day  ?  Types: E-cigarettes  ? Smokeless tobacco: Never  ? Tobacco comments:  ?  vapes  ?Vaping Use  ? Vaping Use: Some days  ?Substance Use Topics  ? Alcohol use: Yes  ?  Comment: rare  ? Drug use: No  ? ? ? ?Allergies   ?Patient has no known allergies. ? ? ?Review of Systems ?Review of Systems   ?Constitutional:  Negative for appetite change, chills and fever.  ?HENT:  Negative for congestion, ear pain, rhinorrhea, sinus pressure, sinus pain and sore throat.   ?Eyes:  Negative for redness and visual disturbance.  ?Respiratory:  Negative for cough, chest tightness, shortness of breath and wheezing.   ?Cardiovascular:  Negative for chest pain and palpitations.  ?Gastrointestinal:  Negative for abdominal pain, constipation, diarrhea, nausea and vomiting.  ?Genitourinary:  Negative for dysuria, frequency and urgency.  ?Musculoskeletal:  Negative for myalgias.  ?Neurological:  Positive for dizziness. Negative for weakness and headaches.  ?Psychiatric/Behavioral:  Negative for confusion.   ?All other systems reviewed and are negative. ? ? ?Physical Exam ?Triage Vital Signs ?ED Triage Vitals  ?Enc Vitals Group  ?   BP 08/04/21 1413 103/74  ?   Pulse Rate 08/04/21 1413 (!) 108  ?   Resp 08/04/21 1413 18  ?   Temp 08/04/21 1413 97.8 ?F (36.6 ?C)  ?   Temp src --   ?   SpO2 08/04/21 1413 98 %  ?   Weight --   ?   Height --   ?   Head Circumference --   ?   Peak Flow --   ?   Pain Score 08/04/21 1411 4  ?   Pain Loc --   ?   Pain Edu? --   ?   Excl. in [place]? --   ? ?Orthostatic VS for the past 24 hrs: ? BP- Lying Pulse- Lying BP- Sitting Pulse- Sitting BP- Standing at 0 minutes Pulse- Standing at 0 minutes  ?08/04/21 1414 103/70 97 96/68 109 (!) 86/64 117  ? ? ?Updated Vital Signs ?BP 103/74   Pulse (!) 108   Temp 97.8 ?F (36.6 ?C)   Resp 18   LMP 05/31/2018   SpO2 98%  ? ?Visual Acuity ?Right Eye Distance:   ?Left Eye Distance:   ?Bilateral Distance:   ? ?Right Eye Near:   ?Left Eye Near:    ?Bilateral Near:    ? ?Physical Exam ?Vitals reviewed.  ?Constitutional:   ?   General: She is not in acute distress. ?   Appearance: Normal appearance. She is not ill-appearing.  ?HENT:  ?   Head: Normocephalic and atraumatic.  ?   Right Ear: Hearing, tympanic membrane, ear canal and external ear normal. No swelling or  tenderness. No middle ear effusion. There is no impacted cerumen. No mastoid tenderness. Tympanic membrane is not injected, scarred, perforated, erythematous, retracted or bulging.  ?   Left Ear: Hearing, tympanic membrane, ear canal and external ear normal. No swelling or tenderness.  No middle ear effusion. There  is no impacted cerumen. No mastoid tenderness. Tympanic membrane is not injected, scarred, perforated, erythematous, retracted or bulging.  ?   Mouth/Throat:  ?   Pharynx: Oropharynx is clear. No oropharyngeal exudate or posterior oropharyngeal erythema.  ?Eyes:  ?   Extraocular Movements: Extraocular movements intact.  ?   Pupils: Pupils are equal, round, and reactive to light.  ?Cardiovascular:  ?   Rate and Rhythm: Regular rhythm. Tachycardia present.  ?   Heart sounds: Normal heart sounds.  ?Pulmonary:  ?   Effort: Pulmonary effort is normal.  ?   Breath sounds: Normal breath sounds. No wheezing, rhonchi or rales.  ?Musculoskeletal:  ?   Cervical back: Normal range of motion and neck supple. No rigidity.  ?Lymphadenopathy:  ?   Cervical: No cervical adenopathy.  ?Skin: ?   Capillary Refill: Capillary refill takes 2 to 3 seconds.  ?Neurological:  ?   General: No focal deficit present.  ?   Mental Status: She is alert and oriented to person, place, and time. Mental status is at baseline.  ?   Cranial Nerves: No cranial nerve deficit or facial asymmetry.  ?   Sensory: Sensation is intact. No sensory deficit.  ?   Motor: Motor function is intact. No weakness.  ?   Coordination: Coordination is intact. Coordination normal.  ?   Gait: Gait is intact. Gait normal.  ?   Comments: CN 2-12 intact. No weakness or numbness in UEs or LEs. Negative rhomberg, pronator drift, fingers to thumb. Gait intact but dizziness elicited with standing.  ?Psychiatric:     ?   Mood and Affect: Mood normal.     ?   Behavior: Behavior normal.     ?   Thought Content: Thought content normal.     ?   Judgment: Judgment normal.   ? ? ? ?UC Treatments / Results  ?Labs ?(all labs ordered are listed, but only abnormal results are displayed) ?Labs Reviewed - No data to display ? ?EKG ? ? ?Radiology ?No results found. ? ?Procedures ?Procedures (including cr

## 2021-08-04 NOTE — Discharge Instructions (Addendum)
-  Take the Zofran (ondansetron) up to 3 times daily for nausea and vomiting. Dissolve one pill under your tongue or between your teeth and your cheek. ?-Meclizine for dizziness. This medication will cause drowsiness. Do not take together with ambien.  ?-Drink plenty of fluids at home.  If you cannot keep fluids down, head to the emergency department for IV fluids. ?-Head to the emergency department if you experience the worst headache of your life, vision changes, weakness. ?

## 2021-10-21 ENCOUNTER — Ambulatory Visit: Payer: Self-pay

## 2021-10-21 ENCOUNTER — Ambulatory Visit (INDEPENDENT_AMBULATORY_CARE_PROVIDER_SITE_OTHER): Payer: 59

## 2021-10-21 ENCOUNTER — Ambulatory Visit
Admission: EM | Admit: 2021-10-21 | Discharge: 2021-10-21 | Disposition: A | Discharge status: Home / Self Care | Payer: 59 | Attending: Family Medicine | Admitting: Family Medicine

## 2021-10-21 DIAGNOSIS — M79642 Pain in left hand: Secondary | ICD-10-CM | POA: Diagnosis not present

## 2021-10-21 MED ORDER — DEXAMETHASONE SODIUM PHOSPHATE 10 MG/ML IJ SOLN
10.0000 mg | Freq: Once | INTRAMUSCULAR | Status: AC
  Administered 2021-10-21: 10 mg via INTRAMUSCULAR

## 2021-10-21 NOTE — ED Provider Notes (Signed)
[facility]    CSN: [id number] Arrival date & time: 10/21/21  8588      History   Chief Complaint Chief Complaint  Patient presents with   Hand Pain    HPI Cheryl Dickerson is a 51 y.o. female.   Presenting today with sudden onset left proximal hand redness, swelling, significant pain with no known injury or inciting event.  Had some numbness into the hand yesterday but today no weakness, numbness, fever, chills, skin lesions.  She has not been trying anything apart from ice for symptoms.  No past history of similar issues.    Past Medical History:  Diagnosis Date   Diabetes mellitus without complication ([place])     There are no problems to display for this patient.   Past Surgical History:  Procedure Laterality Date   BALLOON DILATION N/A 06/04/2021   Procedure: BALLOON DILATION;  Surgeon: Eloise Harman, DO;  [facility];  Service: Endoscopy;  Laterality: N/A;   BIOPSY  06/04/2021   Procedure: BIOPSY;  Surgeon: Eloise Harman, DO;  [facility];  Service: Endoscopy;;   CESAREAN SECTION     CHOLECYSTECTOMY     COLONOSCOPY WITH PROPOFOL N/A 06/04/2021   Procedure: COLONOSCOPY WITH PROPOFOL;  Surgeon: Eloise Harman, DO;  [facility];  Service: Endoscopy;  Laterality: N/A;  9:45am   ESOPHAGOGASTRODUODENOSCOPY (EGD) WITH PROPOFOL N/A 06/04/2021   Procedure: ESOPHAGOGASTRODUODENOSCOPY (EGD) WITH PROPOFOL;  Surgeon: Eloise Harman, DO;  [facility];  Service: Endoscopy;  Laterality: N/A;   EYE SURGERY     KNEE SURGERY      OB History     Gravida  2   Para  2   Term  2   Preterm  0   AB  0   Living         SAB  0   IAB  0   Ectopic  0   Multiple      Live Births               Home Medications    Prior to Admission medications   Medication Sig Start Date End Date Taking? Authorizing Provider  aspirin-acetaminophen-caffeine (EXCEDRIN MIGRAINE) [phone] MG tablet Take 1 tablet by  mouth daily as needed (headache).    [provider]  chlorpheniramine (CHLOR-TRIMETON) 4 MG tablet Take 4 mg by mouth daily as needed for allergies.    [provider]  cyclobenzaprine (FLEXERIL) 10 MG tablet Take 1 tablet (10 mg total) by mouth 2 (two) times daily as needed for muscle spasms. 06/22/21   Hazel Sams, PA-C  meclizine (ANTIVERT) 12.5 MG tablet Take 1 tablet (12.5 mg total) by mouth 3 (three) times daily as needed for dizziness. 08/04/21   Hazel Sams, PA-C  metFORMIN (GLUCOPHAGE) 1000 MG tablet Take 1,000 mg by mouth 2 (two) times daily with a meal.    [provider]  omeprazole (PRILOSEC) 40 MG capsule Take 1 capsule (40 mg total) by mouth 2 (two) times daily before a meal. 06/04/21 12/01/21  Carver, Elon Alas, DO  ondansetron (ZOFRAN-ODT) 4 MG disintegrating tablet Take 1 tablet (4 mg total) by mouth every 8 (eight) hours as needed for nausea or vomiting. 08/04/21   Hazel Sams, PA-C    Family History Family History  Problem Relation Age of Onset   Cancer Mother    Heart attack Father    Diabetes Other     Social  History Social History   Tobacco Use   Smoking status: Every Day    Types: E-cigarettes   Smokeless tobacco: Never   Tobacco comments:    vapes  Vaping Use   Vaping Use: Some days  Substance Use Topics   Alcohol use: Yes    Comment: rare   Drug use: No     Allergies   Patient has no known allergies.   Review of Systems Review of Systems Per HPI  Physical Exam Triage Vital Signs ED Triage Vitals  Enc Vitals Group     BP 10/21/21 1011 105/72     Pulse Rate 10/21/21 1011 (!) 105     Resp 10/21/21 1011 20     Temp 10/21/21 1011 97.9 F (36.6 C)     Temp src --      SpO2 10/21/21 1011 98 %     Weight --      Height --      Head Circumference --      Peak Flow --      Pain Score 10/21/21 1010 6     Pain Loc --      Pain Edu? --      Excl. in [place]? --    No data found.  Updated Vital Signs BP 105/72    Pulse (!) 105   Temp 97.9 F (36.6 C)   Resp 20   LMP 05/31/2018   SpO2 98%   Visual Acuity Right Eye Distance:   Left Eye Distance:   Bilateral Distance:    Right Eye Near:   Left Eye Near:    Bilateral Near:     Physical Exam Vitals and nursing note reviewed.  Constitutional:      Appearance: Normal appearance. She is not ill-appearing.  HENT:     Head: Atraumatic.  Eyes:     Extraocular Movements: Extraocular movements intact.     Conjunctiva/sclera: Conjunctivae normal.  Cardiovascular:     Rate and Rhythm: Normal rate and regular rhythm.     Heart sounds: Normal heart sounds.  Pulmonary:     Effort: Pulmonary effort is normal.     Breath sounds: Normal breath sounds.  Musculoskeletal:        General: Swelling and tenderness present. No deformity or signs of injury. Normal range of motion.     Cervical back: Normal range of motion and neck supple.     Comments: Left proximal hand toward the ulnar aspect erythematous, edematous, significantly tender to palpation without bony deformity or loss of range of motion  Skin:    General: Skin is warm and dry.     Findings: Erythema present.  Neurological:     Mental Status: She is alert and oriented to person, place, and time.     Comments: Left upper extremity neurovascularly intact  Psychiatric:        Mood and Affect: Mood normal.        Thought Content: Thought content normal.        Judgment: Judgment normal.      UC Treatments / Results  Labs (all labs ordered are listed, but only abnormal results are displayed) Labs Reviewed - No data to display  EKG   Radiology DG Hand Complete Left  Result Date: 10/21/2021 CLINICAL DATA:  Pain and swelling EXAM: LEFT HAND - COMPLETE 3+ VIEW COMPARISON:  None Available. FINDINGS: There is no evidence of fracture or dislocation. No evidence of arthropathy or other focal bone abnormality. Diffuse demineralization. Soft  tissues are unremarkable. IMPRESSION: No acute osseous  abnormality. Electronically Signed   By: Yetta Glassman M.D.   On: 10/21/2021 10:25    Procedures Procedures (including critical care time)  Medications Ordered in UC Medications  dexamethasone (DECADRON) injection 10 mg (10 mg Intramuscular Given 10/21/21 1044)    Initial Impression / Assessment and Plan / UC Course  I have reviewed the triage vital signs and the nursing notes.  Pertinent labs & imaging results that were available during my care of the patient were reviewed by me and considered in my medical decision making (see chart for details).     X-ray negative for acute bony abnormality and no known injury to the area.  Suspect gout flare, treat with IM Decadron, Epsom salt soaks, ice, elevation.  Over-the-counter pain relievers as needed.  Return for worsening symptoms.  Final Clinical Impressions(s) / UC Diagnoses   Final diagnoses:  Left hand pain   Discharge Instructions   None    ED Prescriptions   None    PDMP not reviewed this encounter.   Volney American, [place] 10/21/21 1351

## 2021-10-21 NOTE — ED Triage Notes (Signed)
Pt presents with c/o left hand pain and swelling. That began yesterday . Denies injury

## 2021-12-16 ENCOUNTER — Encounter: Payer: Self-pay | Admitting: *Deleted

## 2022-01-28 ENCOUNTER — Other Ambulatory Visit (HOSPITAL_COMMUNITY): Payer: Self-pay | Admitting: Family Medicine

## 2022-01-28 ENCOUNTER — Encounter (HOSPITAL_COMMUNITY): Payer: Self-pay | Admitting: Family Medicine

## 2022-01-28 DIAGNOSIS — S8991XA Unspecified injury of right lower leg, initial encounter: Secondary | ICD-10-CM

## 2022-02-19 ENCOUNTER — Ambulatory Visit (HOSPITAL_COMMUNITY): Payer: 59

## 2022-02-27 ENCOUNTER — Ambulatory Visit (HOSPITAL_COMMUNITY)
Admission: RE | Admit: 2022-02-27 | Discharge: 2022-02-27 | Disposition: A | Discharge status: Home / Self Care | Payer: 59 | Source: Ambulatory Visit | Attending: Family Medicine | Admitting: Family Medicine

## 2022-02-27 DIAGNOSIS — S8991XA Unspecified injury of right lower leg, initial encounter: Secondary | ICD-10-CM | POA: Diagnosis not present

## 2022-03-25 ENCOUNTER — Ambulatory Visit
Admission: RE | Admit: 2022-03-25 | Discharge: 2022-03-25 | Disposition: A | Discharge status: Home / Self Care | Payer: 59 | Source: Ambulatory Visit

## 2022-03-25 VITALS — BP 123/84 | HR 102 | Temp 98.2°F | Resp 18

## 2022-03-25 DIAGNOSIS — S161XXA Strain of muscle, fascia and tendon at neck level, initial encounter: Secondary | ICD-10-CM

## 2022-03-25 HISTORY — DX: Dizziness and giddiness: R42

## 2022-03-25 HISTORY — DX: Pure hypercholesterolemia, unspecified: E78.00

## 2022-03-25 MED ORDER — CYCLOBENZAPRINE HCL 5 MG PO TABS: 5.0000 mg | ORAL_TABLET | Freq: Three times a day (TID) | ORAL | 0 refills | Status: AC | PRN

## 2022-03-25 MED ORDER — DEXAMETHASONE SODIUM PHOSPHATE 10 MG/ML IJ SOLN
10.0000 mg | Freq: Once | INTRAMUSCULAR | Status: AC
  Administered 2022-03-25: 10 mg via INTRAMUSCULAR

## 2022-03-25 NOTE — ED Provider Notes (Signed)
[facility]    CSN: [id number] Arrival date & time: 03/25/22  1649      History   Chief Complaint Chief Complaint  Patient presents with   Torticollis    APPT 5:00     HPI Cheryl Dickerson is a 51 y.o. female.   Patient presenting today with neck tightness and spasms that initially was on the right side of the neck extending down toward her shoulder and is now also on the left side.  States pain and stiffness with movement but range of motion seems intact.  Denies injury prior to onset of symptoms, shooting pain down arms, weakness, tingling.  Taking meloxicam Aleve, Flexeril once, heat with no relief.    Past Medical History:  Diagnosis Date   Diabetes mellitus without complication ([place])    High cholesterol    Vertigo     There are no problems to display for this patient.   Past Surgical History:  Procedure Laterality Date   BALLOON DILATION N/A 06/04/2021   Procedure: BALLOON DILATION;  Surgeon: Eloise Harman, DO;  [facility];  Service: Endoscopy;  Laterality: N/A;   BIOPSY  06/04/2021   Procedure: BIOPSY;  Surgeon: Eloise Harman, DO;  [facility];  Service: Endoscopy;;   CESAREAN SECTION     CHOLECYSTECTOMY     COLONOSCOPY WITH PROPOFOL N/A 06/04/2021   Procedure: COLONOSCOPY WITH PROPOFOL;  Surgeon: Eloise Harman, DO;  [facility];  Service: Endoscopy;  Laterality: N/A;  9:45am   ESOPHAGOGASTRODUODENOSCOPY (EGD) WITH PROPOFOL N/A 06/04/2021   Procedure: ESOPHAGOGASTRODUODENOSCOPY (EGD) WITH PROPOFOL;  Surgeon: Eloise Harman, DO;  [facility];  Service: Endoscopy;  Laterality: N/A;   EYE SURGERY     KNEE SURGERY      OB History     Gravida  2   Para  2   Term  2   Preterm  0   AB  0   Living         SAB  0   IAB  0   Ectopic  0   Multiple      Live Births               Home Medications    Prior to Admission medications   Medication Sig Start Date End Date Taking?  Authorizing Provider  aspirin-acetaminophen-caffeine (EXCEDRIN MIGRAINE) [phone] MG tablet Take 1 tablet by mouth daily as needed (headache).   Yes [provider]  chlorpheniramine (CHLOR-TRIMETON) 4 MG tablet Take 4 mg by mouth daily as needed for allergies.   Yes [provider]  cyclobenzaprine (FLEXERIL) 10 MG tablet Take 1 tablet (10 mg total) by mouth 2 (two) times daily as needed for muscle spasms. 06/22/21  Yes Hazel Sams, PA-C  cyclobenzaprine (FLEXERIL) 5 MG tablet Take 1 tablet (5 mg total) by mouth 3 (three) times daily as needed for muscle spasms. Do not drink alcohol or drive while taking this medication.  May cause drowsiness. 03/25/22  Yes Volney American, PA-C  meclizine (ANTIVERT) 12.5 MG tablet Take 1 tablet (12.5 mg total) by mouth 3 (three) times daily as needed for dizziness. 08/04/21  Yes Hazel Sams, PA-C  meloxicam (MOBIC) 7.5 MG tablet Take 7.5 mg by mouth daily.   Yes [provider]  metFORMIN (GLUCOPHAGE) 1000 MG tablet Take 1,000 mg by mouth 2 (two) times daily with a meal.   Yes [provider]  omeprazole (PRILOSEC) 40  MG capsule Take 1 capsule (40 mg total) by mouth 2 (two) times daily before a meal. 06/04/21 03/25/22 Yes Carver, Charles K, DO  ondansetron (ZOFRAN-ODT) 4 MG disintegrating tablet Take 1 tablet (4 mg total) by mouth every 8 (eight) hours as needed for nausea or vomiting. 08/04/21  Yes Hazel Sams, PA-C  rosuvastatin (CRESTOR) 5 MG tablet Take 5 mg by mouth daily.   Yes [provider]  zolpidem (AMBIEN CR) 12.5 MG CR tablet Take 12.5 mg by mouth at bedtime as needed for sleep.   Yes [provider]    Family History Family History  Problem Relation Age of Onset   Cancer Mother    Heart attack Father    Diabetes Other     Social History Social History   Tobacco Use   Smoking status: Every Day    Types: E-cigarettes   Smokeless tobacco: Never   Tobacco comments:    vapes   Vaping Use   Vaping Use: Some days  Substance Use Topics   Alcohol use: Yes    Comment: rare   Drug use: No     Allergies   Patient has no known allergies.   Review of Systems Review of Systems HPI  Physical Exam Triage Vital Signs ED Triage Vitals  Enc Vitals Group     BP 03/25/22 1719 123/84     Pulse Rate 03/25/22 1719 (!) 102     Resp 03/25/22 1719 18     Temp 03/25/22 1719 98.2 F (36.8 C)     Temp Source 03/25/22 1719 Oral     SpO2 03/25/22 1719 98 %     Weight --      Height --      Head Circumference --      Peak Flow --      Pain Score 03/25/22 1714 10     Pain Loc --      Pain Edu? --      Excl. in [place]? --    No data found.  Updated Vital Signs BP 123/84 (BP Location: Right Arm)   Pulse (!) 102   Temp 98.2 F (36.8 C) (Oral)   Resp 18   LMP 05/31/2018   SpO2 98%   Visual Acuity Right Eye Distance:   Left Eye Distance:   Bilateral Distance:    Right Eye Near:   Left Eye Near:    Bilateral Near:     Physical Exam Vitals and nursing note reviewed.  Constitutional:      Appearance: Normal appearance. She is not ill-appearing.  HENT:     Head: Atraumatic.  Eyes:     Extraocular Movements: Extraocular movements intact.     Conjunctiva/sclera: Conjunctivae normal.  Cardiovascular:     Rate and Rhythm: Normal rate and regular rhythm.     Heart sounds: Normal heart sounds.  Pulmonary:     Effort: Pulmonary effort is normal.     Breath sounds: Normal breath sounds.  Musculoskeletal:        General: Tenderness present. No swelling, deformity or signs of injury. Normal range of motion.     Cervical back: Normal range of motion and neck supple.     Comments: No midline spinal tenderness to palpation of the cervical spine.  Bilateral cervical paraspinal muscles into SCM's and trapezius tender to palpation and mild spasm.  Range of motion appears intact  Skin:    General: Skin is warm and dry.  Neurological:  Mental Status: She is alert  and oriented to person, place, and time.     Motor: No weakness.     Gait: Gait normal.     Comments: Bilateral upper extremities neurovascularly intact  Psychiatric:        Mood and Affect: Mood normal.        Thought Content: Thought content normal.        Judgment: Judgment normal.      UC Treatments / Results  Labs (all labs ordered are listed, but only abnormal results are displayed) Labs Reviewed - No data to display  EKG   Radiology No results found.  Procedures Procedures (including critical care time)  Medications Ordered in UC Medications  dexamethasone (DECADRON) injection 10 mg (10 mg Intramuscular Given 03/25/22 1743)    Initial Impression / Assessment and Plan / UC Course  I have reviewed the triage vital signs and the nursing notes.  Pertinent labs & imaging results that were available during my care of the patient were reviewed by me and considered in my medical decision making (see chart for details).     Treat with IM Decadron, Flexeril, heat, massage, stretches, rest.  Return for worsening symptoms.  No red flag findings today.  Final Clinical Impressions(s) / UC Diagnoses   Final diagnoses:  Acute strain of neck muscle, initial encounter   Discharge Instructions   None    ED Prescriptions     Medication Sig Dispense Auth. Provider   cyclobenzaprine (FLEXERIL) 5 MG tablet Take 1 tablet (5 mg total) by mouth 3 (three) times daily as needed for muscle spasms. Do not drink alcohol or drive while taking this medication.  May cause drowsiness. 15 tablet Volney American, [place]      PDMP not reviewed this encounter.   Volney American, [place] 03/25/22 1801

## 2022-03-25 NOTE — ED Triage Notes (Signed)
Pt presents with worsening neck tightness/spasms x 4 days.  Has tried Meloxicam, Flexeril, Aleve, heat with no relief. Back of neck and going toward left side. Pain has brought her to tears.

## 2022-05-21 ENCOUNTER — Other Ambulatory Visit: Payer: Self-pay

## 2022-05-21 ENCOUNTER — Ambulatory Visit
Admission: RE | Admit: 2022-05-21 | Discharge: 2022-05-21 | Disposition: A | Discharge status: Home / Self Care | Payer: 59 | Source: Ambulatory Visit

## 2022-05-21 VITALS — BP 127/88 | HR 95 | Temp 97.9°F | Resp 20

## 2022-05-21 DIAGNOSIS — J101 Influenza due to other identified influenza virus with other respiratory manifestations: Secondary | ICD-10-CM | POA: Diagnosis not present

## 2022-05-21 LAB — POCT RAPID STREP A (OFFICE): Rapid Strep A Screen: NEGATIVE

## 2022-05-21 LAB — POCT INFLUENZA A/B
Influenza A, POC: POSITIVE — AB
Influenza B, POC: NEGATIVE

## 2022-05-21 MED ORDER — OSELTAMIVIR PHOSPHATE 75 MG PO CAPS: 75.0000 mg | ORAL_CAPSULE | Freq: Two times a day (BID) | ORAL | 0 refills | Status: DC

## 2022-05-21 NOTE — ED Triage Notes (Signed)
Pt reports fatigue, headache, sore throat, chills since this am.

## 2022-05-21 NOTE — ED Provider Notes (Signed)
[facility]    CSN: [id number] Arrival date & time: 05/21/22  1746      History   Chief Complaint Chief Complaint  Patient presents with   Generalized Body Aches    Fatigue, sore throat, headaches - Entered by patient    HPI Samyria Mckeithen is a 52 y.o. female.   The history is provided by the patient.   The patient presents for complaints of fatigue, body aches, chills, fever, sore throat, headache, cough that started this morning.  Patient states that her headache has improved since that time.  She denies ear pain, nasal congestion, runny nose, wheezing, shortness of breath, difficulty breathing, or GI symptoms.  Patient states she has been vaccinated for COVID and influenza.  She states several of her coworkers have been sick with [place] or flu.  She has not taken any medication for her symptoms.  Past Medical History:  Diagnosis Date   Diabetes mellitus without complication ([place])    High cholesterol    Vertigo     There are no problems to display for this patient.   Past Surgical History:  Procedure Laterality Date   BALLOON DILATION N/A 06/04/2021   Procedure: BALLOON DILATION;  Surgeon: Eloise Harman, DO;  [facility];  Service: Endoscopy;  Laterality: N/A;   BIOPSY  06/04/2021   Procedure: BIOPSY;  Surgeon: Eloise Harman, DO;  [facility];  Service: Endoscopy;;   CESAREAN SECTION     CHOLECYSTECTOMY     COLONOSCOPY WITH PROPOFOL N/A 06/04/2021   Procedure: COLONOSCOPY WITH PROPOFOL;  Surgeon: Eloise Harman, DO;  [facility];  Service: Endoscopy;  Laterality: N/A;  9:45am   ESOPHAGOGASTRODUODENOSCOPY (EGD) WITH PROPOFOL N/A 06/04/2021   Procedure: ESOPHAGOGASTRODUODENOSCOPY (EGD) WITH PROPOFOL;  Surgeon: Eloise Harman, DO;  [facility];  Service: Endoscopy;  Laterality: N/A;   EYE SURGERY     KNEE SURGERY      OB History     Gravida  2   Para  2   Term  2   Preterm  0   AB  0   Living          SAB  0   IAB  0   Ectopic  0   Multiple      Live Births               Home Medications    Prior to Admission medications   Medication Sig Start Date End Date Taking? Authorizing Provider  ALPRAZolam Duanne Moron) 1 MG tablet Take 1 mg by mouth as needed for anxiety.   Yes [provider]  oseltamivir (TAMIFLU) 75 MG capsule Take 1 capsule (75 mg total) by mouth every 12 (twelve) hours. 05/21/22  Yes Therisa Mennella-Warren, Alda Lea, NP  aspirin-acetaminophen-caffeine (EXCEDRIN MIGRAINE) [phone] MG tablet Take 1 tablet by mouth daily as needed (headache).    [provider]  chlorpheniramine (CHLOR-TRIMETON) 4 MG tablet Take 4 mg by mouth daily as needed for allergies.    [provider]  cyclobenzaprine (FLEXERIL) 10 MG tablet Take 1 tablet (10 mg total) by mouth 2 (two) times daily as needed for muscle spasms. 06/22/21   Hazel Sams, PA-C  cyclobenzaprine (FLEXERIL) 5 MG tablet Take 1 tablet (5 mg total) by mouth 3 (three) times daily as needed for muscle spasms. Do not drink alcohol or drive while taking this medication.  May cause drowsiness. 03/25/22   Volney American, PA-C  meclizine (ANTIVERT) 12.5 MG tablet Take 1 tablet (12.5 mg total) by mouth 3 (three) times daily as needed for dizziness. 08/04/21   Hazel Sams, PA-C  meloxicam (MOBIC) 7.5 MG tablet Take 7.5 mg by mouth as needed.    [provider]  metFORMIN (GLUCOPHAGE) 1000 MG tablet Take 1,000 mg by mouth 2 (two) times daily with a meal.    [provider]  omeprazole (PRILOSEC) 40 MG capsule Take 1 capsule (40 mg total) by mouth 2 (two) times daily before a meal. 06/04/21 03/25/22  Eloise Harman, DO  ondansetron (ZOFRAN-ODT) 4 MG disintegrating tablet Take 1 tablet (4 mg total) by mouth every 8 (eight) hours as needed for nausea or vomiting. 08/04/21   Hazel Sams, PA-C  rosuvastatin (CRESTOR) 5 MG tablet Take 5 mg by mouth daily.    [provider]   zolpidem (AMBIEN CR) 12.5 MG CR tablet Take 12.5 mg by mouth at bedtime as needed for sleep.    [provider]    Family History Family History  Problem Relation Age of Onset   Cancer Mother    Heart attack Father    Diabetes Other     Social History Social History   Tobacco Use   Smoking status: Every Day    Types: E-cigarettes   Smokeless tobacco: Never   Tobacco comments:    vapes  Vaping Use   Vaping Use: Some days  Substance Use Topics   Alcohol use: Yes    Comment: rare   Drug use: No     Allergies   Patient has no known allergies.   Review of Systems Review of Systems Per HPI  Physical Exam Triage Vital Signs ED Triage Vitals  Enc Vitals Group     BP 05/21/22 1828 127/88     Pulse Rate 05/21/22 1828 95     Resp 05/21/22 1828 20     Temp 05/21/22 1828 97.9 F (36.6 C)     Temp Source 05/21/22 1828 Oral     SpO2 05/21/22 1828 94 %     Weight --      Height --      Head Circumference --      Peak Flow --      Pain Score 05/21/22 1826 0     Pain Loc --      Pain Edu? --      Excl. in [place]? --    No data found.  Updated Vital Signs BP 127/88 (BP Location: Right Arm)   Pulse 95   Temp 97.9 F (36.6 C) (Oral)   Resp 20   LMP 05/31/2018   SpO2 94%   Visual Acuity Right Eye Distance:   Left Eye Distance:   Bilateral Distance:    Right Eye Near:   Left Eye Near:    Bilateral Near:     Physical Exam Vitals and nursing note reviewed.  Constitutional:      Appearance: Normal appearance. She is not toxic-appearing.  HENT:     Head: Normocephalic.     Right Ear: Tympanic membrane, ear canal and external ear normal.     Left Ear: Tympanic membrane, ear canal and external ear normal.     Nose: Nose normal.     Mouth/Throat:     Mouth: Mucous membranes are moist.     Pharynx: Posterior oropharyngeal erythema present. No oropharyngeal exudate.  Eyes:     Extraocular Movements: Extraocular movements intact.  Conjunctiva/sclera: Conjunctivae normal.     Pupils: Pupils are equal, round, and reactive to light.  Cardiovascular:     Rate and Rhythm: Normal rate and regular rhythm.     Pulses: Normal pulses.     Heart sounds: Normal heart sounds.  Pulmonary:     Effort: Pulmonary effort is normal. No respiratory distress.     Breath sounds: Normal breath sounds. No stridor. No wheezing, rhonchi or rales.  Abdominal:     General: Bowel sounds are normal.     Palpations: Abdomen is soft.     Tenderness: There is no abdominal tenderness.  Musculoskeletal:     Cervical back: Normal range of motion.  Lymphadenopathy:     Cervical: No cervical adenopathy.  Skin:    General: Skin is warm and dry.  Neurological:     General: No focal deficit present.     Mental Status: She is alert and oriented to person, place, and time.  Psychiatric:        Mood and Affect: Mood normal.        Behavior: Behavior normal.      UC Treatments / Results  Labs (all labs ordered are listed, but only abnormal results are displayed) Labs Reviewed  POCT INFLUENZA A/B - Abnormal; Notable for the following components:      Result Value   Influenza A, POC Positive (*)    All other components within normal limits  POCT RAPID STREP A (OFFICE)    EKG   Radiology No results found.  Procedures Procedures (including critical care time)  Medications Ordered in UC Medications - No data to display  Initial Impression / Assessment and Plan / UC Course  I have reviewed the triage vital signs and the nursing notes.  Pertinent labs & imaging results that were available during my care of the patient were reviewed by me and considered in my medical decision making (see chart for details).  The patient is well-appearing, she is in no acute distress, vital signs are stable.  Rapid strep test is negative, influenza test is positive for flu A.  Will start patient on Tamiflu 75 mg twice daily for the next 5 days.   Supportive care recommendations were provided to the patient to include increasing fluids, allowing for plenty of rest, and over-the-counter analgesics for pain or discomfort.  Discussed viral etiology with the patient and when follow-up may be indicated.  Patient is in agreement with this plan of care and verbalizes understanding.  All questions were answered.  Patient stable for discharge.   Final Clinical Impressions(s) / UC Diagnoses   Final diagnoses:  Influenza A     Discharge Instructions      Test was negative, you did test positive for influenza A. Take medication as prescribed. Increase fluids and allow for plenty of rest. Recommend over-the-counter Tylenol or ibuprofen as needed for pain, fever, or general discomfort.  Please be advised that you should not return to your normal activities until you have been fever free for at least 24 hours with no medication. For your cough, will be helpful for you to use a humidifier in your bedroom at nighttime during sleep and sleeping elevated on pillows while cough symptoms persist. Warm salt water gargles 3-4 times daily while throat symptoms persist. Please be advised that a viral illness may last from 7 to 14 days.  If your symptoms suddenly worsen before that time, or extend beyond that time, please follow-up with your primary care physician  or in this clinic for further evaluation. Follow-up as needed.     ED Prescriptions     Medication Sig Dispense Auth. Provider   oseltamivir (TAMIFLU) 75 MG capsule Take 1 capsule (75 mg total) by mouth every 12 (twelve) hours. 10 capsule Samanvi Cuccia-Warren, Alda Lea, NP      PDMP not reviewed this encounter.   Tish Men, NP 05/21/22 1905

## 2022-05-21 NOTE — Discharge Instructions (Addendum)
Test was negative, you did test positive for influenza A. Take medication as prescribed. Increase fluids and allow for plenty of rest. Recommend over-the-counter Tylenol or ibuprofen as needed for pain, fever, or general discomfort.  Please be advised that you should not return to your normal activities until you have been fever free for at least 24 hours with no medication. For your cough, will be helpful for you to use a humidifier in your bedroom at nighttime during sleep and sleeping elevated on pillows while cough symptoms persist. Warm salt water gargles 3-4 times daily while throat symptoms persist. Please be advised that a viral illness may last from 7 to 14 days.  If your symptoms suddenly worsen before that time, or extend beyond that time, please follow-up with your primary care physician or in this clinic for further evaluation. Follow-up as needed.

## 2022-08-12 ENCOUNTER — Ambulatory Visit
Admission: EM | Admit: 2022-08-12 | Discharge: 2022-08-12 | Disposition: A | Discharge status: Home / Self Care | Payer: 59 | Attending: Nurse Practitioner | Admitting: Nurse Practitioner

## 2022-08-12 ENCOUNTER — Encounter: Payer: Self-pay | Admitting: Emergency Medicine

## 2022-08-12 ENCOUNTER — Other Ambulatory Visit: Payer: Self-pay

## 2022-08-12 DIAGNOSIS — R300 Dysuria: Secondary | ICD-10-CM | POA: Diagnosis present

## 2022-08-12 LAB — POCT URINALYSIS DIP (MANUAL ENTRY)
Bilirubin, UA: NEGATIVE
Blood, UA: NEGATIVE
Glucose, UA: NEGATIVE mg/dL
Ketones, POC UA: NEGATIVE mg/dL
Nitrite, UA: NEGATIVE
Protein Ur, POC: NEGATIVE mg/dL
Spec Grav, UA: 1.02 (ref 1.010–1.025)
Urobilinogen, UA: 0.2 E.U./dL
pH, UA: 5.5 (ref 5.0–8.0)

## 2022-08-12 MED ORDER — CEPHALEXIN 500 MG PO CAPS: 500.0000 mg | ORAL_CAPSULE | Freq: Two times a day (BID) | ORAL | 0 refills | Status: AC

## 2022-08-12 MED ORDER — PHENAZOPYRIDINE HCL 100 MG PO TABS: 100.0000 mg | ORAL_TABLET | Freq: Three times a day (TID) | ORAL | 0 refills | Status: AC | PRN

## 2022-08-12 NOTE — Discharge Instructions (Signed)
The urine sample today shows a little bit of white bloods.  This means there may be infection in your urine.  Take the Keflex as prescribed to treat it.  We are sending the urine for culture and will call you later this week if it shows we need to treat with a different antibiotic.    Seek care if you develop fever, nausea/vomiting and unable to keep fluids down.

## 2022-08-12 NOTE — ED Provider Notes (Signed)
[facility]    CSN: [id number] Arrival date & time: 08/12/22  1444      History   Chief Complaint Chief Complaint  Patient presents with   Back Pain    HPI Cheryl Dickerson is a 52 y.o. female.   Patient presents today for 1 day history of dysuria, urinary frequency and urgency, right low back pain, and suprapubic pressure.  She denies voiding smaller amounts, new urinary incontinence, foul urinary odor, hematuria, abdominal pain, flank pain, fever, nausea/vomiting, and vaginal discharge.  Patient has not taken anything for symptoms so far.  Denies history of recent UTI in the past couple of years.     Past Medical History:  Diagnosis Date   Diabetes mellitus without complication (HCC)    High cholesterol    Vertigo     There are no problems to display for this patient.   Past Surgical History:  Procedure Laterality Date   BALLOON DILATION N/A 06/04/2021   Procedure: BALLOON DILATION;  Surgeon: Lanelle Bal, DO;  Location: [facility];  Service: Endoscopy;  Laterality: N/A;   BIOPSY  06/04/2021   Procedure: BIOPSY;  Surgeon: Lanelle Bal, DO;  Location: [facility];  Service: Endoscopy;;   CESAREAN SECTION     CHOLECYSTECTOMY     COLONOSCOPY WITH PROPOFOL N/A 06/04/2021   Procedure: COLONOSCOPY WITH PROPOFOL;  Surgeon: Lanelle Bal, DO;  Location: [facility];  Service: Endoscopy;  Laterality: N/A;  9:45am   ESOPHAGOGASTRODUODENOSCOPY (EGD) WITH PROPOFOL N/A 06/04/2021   Procedure: ESOPHAGOGASTRODUODENOSCOPY (EGD) WITH PROPOFOL;  Surgeon: Lanelle Bal, DO;  Location: [facility];  Service: Endoscopy;  Laterality: N/A;   EYE SURGERY     KNEE SURGERY      OB History     Gravida  2   Para  2   Term  2   Preterm  0   AB  0   Living         SAB  0   IAB  0   Ectopic  0   Multiple      Live Births               Home Medications    Prior to Admission medications   Medication Sig Start Date End Date Taking?  Authorizing Provider  cephALEXin (KEFLEX) 500 MG capsule Take 1 capsule (500 mg total) by mouth 2 (two) times daily for 5 days. 08/12/22 08/17/22 Yes Valentino Nose, NP  phenazopyridine (PYRIDIUM) 100 MG tablet Take 1 tablet (100 mg total) by mouth 3 (three) times daily as needed for pain (bladder pain). 08/12/22  Yes Valentino Nose, NP  ALPRAZolam Prudy Feeler) 1 MG tablet Take 1 mg by mouth as needed for anxiety.    [provider]  aspirin-acetaminophen-caffeine (EXCEDRIN MIGRAINE) [phone] MG tablet Take 1 tablet by mouth daily as needed (headache).    [provider]  chlorpheniramine (CHLOR-TRIMETON) 4 MG tablet Take 4 mg by mouth daily as needed for allergies.    [provider]  cyclobenzaprine (FLEXERIL) 5 MG tablet Take 1 tablet (5 mg total) by mouth 3 (three) times daily as needed for muscle spasms. Do not drink alcohol or drive while taking this medication.  May cause drowsiness. Patient not taking: Reported on 08/12/2022 03/25/22   Particia Nearing, PA-C  meclizine (ANTIVERT) 12.5 MG tablet Take 1 tablet (12.5 mg total) by mouth 3 (three) times daily as needed for dizziness. 08/04/21   Ignacia Bayley  E, PA-C  meloxicam (MOBIC) 7.5 MG tablet Take 7.5 mg by mouth as needed.    [provider]  metFORMIN (GLUCOPHAGE) 1000 MG tablet Take 1,000 mg by mouth 2 (two) times daily with a meal.    [provider]  omeprazole (PRILOSEC) 40 MG capsule Take 1 capsule (40 mg total) by mouth 2 (two) times daily before a meal. 06/04/21 03/25/22  Lanelle Bal, DO  ondansetron (ZOFRAN-ODT) 4 MG disintegrating tablet Take 1 tablet (4 mg total) by mouth every 8 (eight) hours as needed for nausea or vomiting. 08/04/21   Rhys Martini, PA-C  rosuvastatin (CRESTOR) 5 MG tablet Take 5 mg by mouth daily.    [provider]  zolpidem (AMBIEN CR) 12.5 MG CR tablet Take 12.5 mg by mouth at bedtime as needed for sleep.    [provider]     Family History Family History  Problem Relation Age of Onset   Cancer Mother    Heart attack Father    Diabetes Other     Social History Social History   Tobacco Use   Smoking status: Every Day    Types: E-cigarettes   Smokeless tobacco: Never   Tobacco comments:    vapes  Vaping Use   Vaping Use: Some days  Substance Use Topics   Alcohol use: Yes    Comment: rare   Drug use: No     Allergies   Patient has no known allergies.   Review of Systems Review of Systems Per HPI  Physical Exam Triage Vital Signs ED Triage Vitals [08/12/22 1545]  Enc Vitals Group     BP 121/85     Pulse Rate 87     Resp 20     Temp (!) 97.4 F (36.3 C)     Temp Source Oral     SpO2 97 %     Weight      Height      Head Circumference      Peak Flow      Pain Score 2     Pain Loc      Pain Edu?      Excl. in GC?    No data found.  Updated Vital Signs BP 121/85 (BP Location: Right Arm)   Pulse 87   Temp (!) 97.4 F (36.3 C) (Oral)   Resp 20   LMP 05/31/2018   SpO2 97%   Visual Acuity Right Eye Distance:   Left Eye Distance:   Bilateral Distance:    Right Eye Near:   Left Eye Near:    Bilateral Near:     Physical Exam Vitals and nursing note reviewed.  Constitutional:      General: She is not in acute distress.    Appearance: She is not toxic-appearing.  Pulmonary:     Effort: Pulmonary effort is normal. No respiratory distress.  Abdominal:     General: Abdomen is flat. Bowel sounds are normal. There is no distension.     Palpations: Abdomen is soft. There is no mass.     Tenderness: There is no abdominal tenderness. There is no right CVA tenderness, left CVA tenderness or guarding.  Skin:    General: Skin is warm and dry.     Coloration: Skin is not jaundiced or pale.     Findings: No erythema.  Neurological:     Mental Status: She is alert and oriented to person, place, and time.     Motor: No weakness.  Gait: Gait normal.  Psychiatric:         Behavior: Behavior is cooperative.      UC Treatments / Results  Labs (all labs ordered are listed, but only abnormal results are displayed) Labs Reviewed  POCT URINALYSIS DIP (MANUAL ENTRY) - Abnormal; Notable for the following components:      Result Value   Leukocytes, UA Small (1+) (*)    All other components within normal limits  URINE CULTURE    EKG   Radiology No results found.  Procedures Procedures (including critical care time)  Medications Ordered in UC Medications - No data to display  Initial Impression / Assessment and Plan / UC Course  I have reviewed the triage vital signs and the nursing notes.  Pertinent labs & imaging results that were available during my care of the patient were reviewed by me and considered in my medical decision making (see chart for details).   Patient is well-appearing, normotensive, afebrile, not tachycardic, not tachypneic, oxygenating well on room air.    1. Dysuria Urinalysis today shows 1+ leukocyte Estrace Urine culture is pending Will treat based on symptoms with cephalexin 500 mg twice daily for 5 days Start Pyridium as needed for bladder pain Strict ER and return precautions discussed with patient  The patient was given the opportunity to ask questions.  All questions answered to their satisfaction.  The patient is in agreement to this plan.    Final Clinical Impressions(s) / UC Diagnoses   Final diagnoses:  Dysuria     Discharge Instructions      The urine sample today shows a little bit of white bloods.  This means there may be infection in your urine.  Take the Keflex as prescribed to treat it.  We are sending the urine for culture and will call you later this week if it shows we need to treat with a different antibiotic.    Seek care if you develop fever, nausea/vomiting and unable to keep fluids down.     ED Prescriptions     Medication Sig Dispense Auth. Provider   cephALEXin (KEFLEX) 500 MG  capsule Take 1 capsule (500 mg total) by mouth 2 (two) times daily for 5 days. 10 capsule Cathlean Marseilles A, NP   phenazopyridine (PYRIDIUM) 100 MG tablet Take 1 tablet (100 mg total) by mouth 3 (three) times daily as needed for pain (bladder pain). 10 tablet Valentino Nose, NP      PDMP not reviewed this encounter.   Valentino Nose, NP 08/12/22 [phone]

## 2022-08-12 NOTE — ED Triage Notes (Signed)
Pt reports lower back pain, intermittent dysuria, and urinary frequency since yesterday. Denies any abd pain, fever, chills, nausea.

## 2022-08-13 ENCOUNTER — Ambulatory Visit: Payer: Self-pay

## 2022-08-14 LAB — URINE CULTURE

## 2022-10-27 ENCOUNTER — Other Ambulatory Visit (HOSPITAL_COMMUNITY): Payer: Self-pay | Admitting: Family Medicine

## 2022-10-27 DIAGNOSIS — Z1231 Encounter for screening mammogram for malignant neoplasm of breast: Secondary | ICD-10-CM

## 2022-11-10 ENCOUNTER — Ambulatory Visit
Admission: EM | Admit: 2022-11-10 | Discharge: 2022-11-10 | Disposition: A | Discharge status: Home / Self Care | Payer: 59 | Attending: Nurse Practitioner | Admitting: Nurse Practitioner

## 2022-11-10 ENCOUNTER — Ambulatory Visit: Payer: 59

## 2022-11-10 ENCOUNTER — Other Ambulatory Visit: Payer: Self-pay

## 2022-11-10 DIAGNOSIS — R0602 Shortness of breath: Secondary | ICD-10-CM

## 2022-11-10 NOTE — Discharge Instructions (Addendum)
The chest x-ray and EKG were normal.  Your blood pressure has also improved. Recommend cessation of vaping as this may be worsening your symptoms. Increase fluids and allow for plenty of rest. If you develop worsening shortness of breath, difficulty breathing, wheezing, or other symptoms, please follow-up in the emergency department immediately for further evaluation. Please follow-up with your primary care physician within the next 5 to 7 days for reevaluation. Follow-up as needed.

## 2022-11-10 NOTE — ED Triage Notes (Signed)
Pt presents with SOB that started 3 days ago. Pt states she does vape and doesn't know if it may be from that.

## 2022-11-10 NOTE — ED Provider Notes (Signed)
[facility]    CSN: [id number] Arrival date & time: 11/10/22  1916      History   Chief Complaint Chief Complaint  Patient presents with   Shortness of Breath    HPI Cheryl Dickerson is a 52 y.o. female.   The history is provided by the patient.   The patient presents for complaints of shortness of breath that is been present for the past 2 to 3 days.  Patient states at work, shortness worsen.  She denies fever, chills, cough, wheezing, difficulty breathing, chest pain, abdominal pain, nausea, vomiting, or diarrhea.  Patient reports that she does vape, and has continued to vape despite her shortness of breath.  Patient also reports she does have a history of diabetes.  Most recent hemoglobin A1c was 6.4 over the past month.  Patient states that she also has a history of anxiety, she takes Xanax as needed.    Past Medical History:  Diagnosis Date   Diabetes mellitus without complication (HCC)    High cholesterol    Vertigo     There are no problems to display for this patient.   Past Surgical History:  Procedure Laterality Date   BALLOON DILATION N/A 06/04/2021   Procedure: BALLOON DILATION;  Surgeon: Lanelle Bal, DO;  Location: [facility];  Service: Endoscopy;  Laterality: N/A;   BIOPSY  06/04/2021   Procedure: BIOPSY;  Surgeon: Lanelle Bal, DO;  Location: [facility];  Service: Endoscopy;;   CESAREAN SECTION     CHOLECYSTECTOMY     COLONOSCOPY WITH PROPOFOL N/A 06/04/2021   Procedure: COLONOSCOPY WITH PROPOFOL;  Surgeon: Lanelle Bal, DO;  Location: [facility];  Service: Endoscopy;  Laterality: N/A;  9:45am   ESOPHAGOGASTRODUODENOSCOPY (EGD) WITH PROPOFOL N/A 06/04/2021   Procedure: ESOPHAGOGASTRODUODENOSCOPY (EGD) WITH PROPOFOL;  Surgeon: Lanelle Bal, DO;  Location: [facility];  Service: Endoscopy;  Laterality: N/A;   EYE SURGERY     KNEE SURGERY      OB History     Gravida  2   Para  2   Term  2   Preterm  0   AB  0    Living         SAB  0   IAB  0   Ectopic  0   Multiple      Live Births               Home Medications    Prior to Admission medications   Medication Sig Start Date End Date Taking? Authorizing Provider  aspirin-acetaminophen-caffeine (EXCEDRIN MIGRAINE) [phone] MG tablet Take 1 tablet by mouth daily as needed (headache).   Yes [provider]  buPROPion (WELLBUTRIN XL) 150 MG 24 hr tablet Take 150 mg by mouth daily. 10/27/22  Yes [provider]  metFORMIN (GLUCOPHAGE) 1000 MG tablet Take 1,000 mg by mouth 2 (two) times daily with a meal.   Yes [provider]  ondansetron (ZOFRAN-ODT) 4 MG disintegrating tablet Take 1 tablet (4 mg total) by mouth every 8 (eight) hours as needed for nausea or vomiting. 08/04/21  Yes Rhys Martini, PA-C  rosuvastatin (CRESTOR) 5 MG tablet Take 5 mg by mouth daily.   Yes [provider]  Vitamin D, Ergocalciferol, (DRISDOL) 1.25 MG (50000 UNIT) CAPS capsule Take 50,000 Units by mouth every 7 (seven) days. 10/27/22  Yes [provider]  zolpidem (AMBIEN CR) 12.5 MG CR tablet Take 12.5 mg by mouth at  bedtime as needed for sleep.   Yes [provider]  ALPRAZolam Prudy Feeler) 1 MG tablet Take 1 mg by mouth as needed for anxiety.    [provider]  chlorpheniramine (CHLOR-TRIMETON) 4 MG tablet Take 4 mg by mouth daily as needed for allergies.    [provider]  cyclobenzaprine (FLEXERIL) 5 MG tablet Take 1 tablet (5 mg total) by mouth 3 (three) times daily as needed for muscle spasms. Do not drink alcohol or drive while taking this medication.  May cause drowsiness. Patient not taking: Reported on 08/12/2022 03/25/22   Particia Nearing, PA-C  meclizine (ANTIVERT) 12.5 MG tablet Take 1 tablet (12.5 mg total) by mouth 3 (three) times daily as needed for dizziness. 08/04/21   Rhys Martini, PA-C  meloxicam (MOBIC) 7.5 MG tablet Take 7.5 mg by mouth as needed.    [provider]  omeprazole (PRILOSEC) 40 MG capsule Take 1 capsule (40 mg total) by mouth 2 (two) times daily before a meal. 06/04/21 03/25/22  Lanelle Bal, DO  phenazopyridine (PYRIDIUM) 100 MG tablet Take 1 tablet (100 mg total) by mouth 3 (three) times daily as needed for pain (bladder pain). 08/12/22   Valentino Nose, NP    Family History Family History  Problem Relation Age of Onset   Cancer Mother    Heart attack Father    Diabetes Other     Social History Social History   Tobacco Use   Smoking status: Every Day    Types: E-cigarettes   Smokeless tobacco: Never   Tobacco comments:    vapes  Vaping Use   Vaping status: Some Days  Substance Use Topics   Alcohol use: Yes    Comment: rare   Drug use: No     Allergies   Patient has no known allergies.   Review of Systems Review of Systems Per HPI  Physical Exam Triage Vital Signs ED Triage Vitals  Encounter Vitals Group     BP 11/10/22 1919 (!) 170/91     Systolic BP Percentile --      Diastolic BP Percentile --      Pulse Rate 11/10/22 1919 94     Resp 11/10/22 1919 18     Temp 11/10/22 1919 (!) 96.7 F (35.9 C)     Temp Source 11/10/22 1919 Axillary     SpO2 11/10/22 1919 99 %     Weight --      Height --      Head Circumference --      Peak Flow --      Pain Score 11/10/22 1923 0     Pain Loc --      Pain Education --      Exclude from Growth Chart --    No data found.  Updated Vital Signs BP 131/87 (BP Location: Right Arm)   Pulse 94   Temp (!) 96.7 F (35.9 C) (Axillary)   Resp 18   LMP 05/31/2018   SpO2 99%   Visual Acuity Right Eye Distance:   Left Eye Distance:   Bilateral Distance:    Right Eye Near:   Left Eye Near:    Bilateral Near:     Physical Exam Vitals and nursing note reviewed.  Constitutional:      General: She is not in acute distress.    Appearance: She is well-developed.  HENT:     Head: Normocephalic.     Mouth/Throat:     Mouth:  Mucous membranes  are moist.  Eyes:     Extraocular Movements: Extraocular movements intact.     Conjunctiva/sclera: Conjunctivae normal.     Pupils: Pupils are equal, round, and reactive to light.  Cardiovascular:     Rate and Rhythm: Normal rate and regular rhythm.     Pulses: Normal pulses.     Heart sounds: Normal heart sounds.  Pulmonary:     Effort: Pulmonary effort is normal.     Breath sounds: Normal breath sounds. No decreased breath sounds, wheezing, rhonchi or rales.  Abdominal:     General: Bowel sounds are normal.     Palpations: Abdomen is soft.  Musculoskeletal:     Cervical back: Normal range of motion.  Lymphadenopathy:     Cervical: No cervical adenopathy.  Skin:    General: Skin is warm and dry.  Neurological:     General: No focal deficit present.     Mental Status: She is alert and oriented to person, place, and time.  Psychiatric:        Mood and Affect: Mood normal.        Behavior: Behavior normal.      UC Treatments / Results  Labs (all labs ordered are listed, but only abnormal results are displayed) Labs Reviewed - No data to display  EKG: Normal sinus rhythm without ectopy, no STEMI.  No other comparisons available.    Radiology DG Chest 2 View  Result Date: 11/10/2022 CLINICAL DATA:  Shortness of breath for 2 days, history of vaping fall initial encounter EXAM: CHEST - 2 VIEW COMPARISON:  04/08/2005 FINDINGS: The heart size and mediastinal contours are within normal limits. Both lungs are clear. The visualized skeletal structures are unremarkable. IMPRESSION: No active cardiopulmonary disease. Electronically Signed   By: Alcide Clever M.D.   On: 11/10/2022 19:33    Procedures Procedures (including critical care time)  Medications Ordered in UC Medications - No data to display  Initial Impression / Assessment and Plan / UC Course  I have reviewed the triage vital signs and the nursing notes.  Pertinent labs & imaging results that were available during my  care of the patient were reviewed by me and considered in my medical decision making (see chart for details).  The patient is well-appearing, she is in no acute distress, vital signs are stable.  Patient presents for complaints of shortness of breath is been present for the past 3 days.  Room air sat at 99%.  Patient is speaking in complete sentences, without difficulty.  Chest x-ray is negative for active cardiopulmonary disease, EKG shows normal sinus rhythm without ectopy.  Difficult to discern the patient's cause of shortness of breath at this time as evaluation is currently negative.  Advised patient to stop vaping.  Patient was advised to follow-up in the emergency department immediately for worsening shortness of breath, difficulty breathing, wheezing, or other symptoms.  Advised patient to follow-up with her primary care within the next 5 to 7 days for reevaluation.  Patient is in agreement with this plan of care and verbalizes understanding.  All questions were answered.  Patient stable for discharge.  Final Clinical Impressions(s) / UC Diagnoses   Final diagnoses:  Shortness of breath     Discharge Instructions      The chest x-ray and EKG were normal.  Your blood pressure has also improved. Recommend cessation of vaping as this may be worsening your symptoms. Increase fluids and allow for plenty of rest. If you  develop worsening shortness of breath, difficulty breathing, wheezing, or other symptoms, please follow-up in the emergency department immediately for further evaluation. Please follow-up with your primary care physician within the next 5 to 7 days for reevaluation. Follow-up as needed.     ED Prescriptions   None    PDMP not reviewed this encounter.   Abran Cantor, NP 11/11/22 [phone]

## 2023-06-02 ENCOUNTER — Ambulatory Visit
Admission: RE | Admit: 2023-06-02 | Discharge: 2023-06-02 | Disposition: A | Discharge status: Home / Self Care | Source: Ambulatory Visit | Attending: Family Medicine | Admitting: Family Medicine

## 2023-06-02 ENCOUNTER — Other Ambulatory Visit: Payer: Self-pay

## 2023-06-02 VITALS — BP 105/75 | HR 101 | Temp 97.9°F | Resp 20

## 2023-06-02 DIAGNOSIS — R112 Nausea with vomiting, unspecified: Secondary | ICD-10-CM | POA: Diagnosis not present

## 2023-06-02 DIAGNOSIS — R6889 Other general symptoms and signs: Secondary | ICD-10-CM

## 2023-06-02 LAB — POC COVID19/FLU A&B COMBO
Covid Antigen, POC: NEGATIVE
Influenza A Antigen, POC: NEGATIVE
Influenza B Antigen, POC: NEGATIVE

## 2023-06-02 MED ORDER — OSELTAMIVIR PHOSPHATE 75 MG PO CAPS
75.0000 mg | ORAL_CAPSULE | Freq: Two times a day (BID) | ORAL | 0 refills | Status: AC
Start: 2023-06-02 — End: ?

## 2023-06-02 MED ORDER — ONDANSETRON 4 MG PO TBDP
4.0000 mg | ORAL_TABLET | Freq: Three times a day (TID) | ORAL | 0 refills | Status: AC | PRN
Start: 2023-06-02 — End: ?

## 2023-06-02 NOTE — ED Provider Notes (Signed)
 [facility]    CSN: [id number] Arrival date & time: 06/02/23  1424      History   Chief Complaint Chief Complaint  Patient presents with   Diarrhea    Throwing up, fever, tired - Entered by patient    HPI Cheryl Dickerson is a 53 y.o. female.   Patient presents today with significant other for a few hours of fever, Tmax 100.6, body aches and chills, sore throat, headache, vomiting, decreased appetite, and fatigue.  She reports episodes of vomiting and has been able to keep fluids down since that time.  No cough, shortness of breath or chest pain, runny or stuffy nose, abdominal pain, diarrhea, or current nausea.  Reports she has been exposed to multiple coworkers with COVID-19 and influenza at work.  Has taken ibuprofen for the fever which does seem to help.  Has been reports he recently had abdominal upset, vomiting, diarrhea after eating out over the weekend.  Reports his symptoms are fully improved.    Past Medical History:  Diagnosis Date   Diabetes mellitus without complication (HCC)    High cholesterol    Vertigo     There are no active problems to display for this patient.   Past Surgical History:  Procedure Laterality Date   BALLOON DILATION N/A 06/04/2021   Procedure: BALLOON DILATION;  Surgeon: Lanelle Bal, DO;  Location: [facility];  Service: Endoscopy;  Laterality: N/A;   BIOPSY  06/04/2021   Procedure: BIOPSY;  Surgeon: Lanelle Bal, DO;  [facility]: [facility];  Service: Endoscopy;;   CESAREAN SECTION     CHOLECYSTECTOMY     COLONOSCOPY WITH PROPOFOL N/A 06/04/2021   Procedure: COLONOSCOPY WITH PROPOFOL;  Surgeon: Lanelle Bal, DO;  Location: [facility];  Service: Endoscopy;  Laterality: N/A;  9:45am   ESOPHAGOGASTRODUODENOSCOPY (EGD) WITH PROPOFOL N/A 06/04/2021   Procedure: ESOPHAGOGASTRODUODENOSCOPY (EGD) WITH PROPOFOL;  Surgeon: Lanelle Bal, DO;  Location: [facility];  Service: Endoscopy;  Laterality: N/A;   EYE SURGERY      KNEE SURGERY      OB History     Gravida  2   Para  2   Term  2   Preterm  0   AB  0   Living         SAB  0   IAB  0   Ectopic  0   Multiple      Live Births               Home Medications    Prior to Admission medications   Medication Sig Start Date End Date Taking? Authorizing Provider  oseltamivir (TAMIFLU) 75 MG capsule Take 1 capsule (75 mg total) by mouth every 12 (twelve) hours. 06/02/23  Yes Valentino Nose, NP  ALPRAZolam Prudy Feeler) 1 MG tablet Take 1 mg by mouth as needed for anxiety.    [provider]  aspirin-acetaminophen-caffeine (EXCEDRIN MIGRAINE) [phone] MG tablet Take 1 tablet by mouth daily as needed (headache).    [provider]  buPROPion (WELLBUTRIN XL) 150 MG 24 hr tablet Take 150 mg by mouth daily. 10/27/22   [provider]  chlorpheniramine (CHLOR-TRIMETON) 4 MG tablet Take 4 mg by mouth daily as needed for allergies.    [provider]  cyclobenzaprine (FLEXERIL) 5 MG tablet Take 1 tablet (5 mg total) by mouth 3 (three) times daily as needed for muscle spasms. Do not drink alcohol or drive while taking  this medication.  May cause drowsiness. Patient not taking: Reported on 08/12/2022 03/25/22   Particia Nearing, PA-C  meclizine (ANTIVERT) 12.5 MG tablet Take 1 tablet (12.5 mg total) by mouth 3 (three) times daily as needed for dizziness. 08/04/21   Rhys Martini, PA-C  meloxicam (MOBIC) 7.5 MG tablet Take 7.5 mg by mouth as needed.    [provider]  metFORMIN (GLUCOPHAGE) 1000 MG tablet Take 1,000 mg by mouth 2 (two) times daily with a meal.    [provider]  omeprazole (PRILOSEC) 40 MG capsule Take 1 capsule (40 mg total) by mouth 2 (two) times daily before a meal. 06/04/21 03/25/22  Lanelle Bal, DO  ondansetron (ZOFRAN-ODT) 4 MG disintegrating tablet Take 1 tablet (4 mg total) by mouth every 8 (eight) hours as needed for nausea or vomiting. 06/02/23   Valentino Nose, NP  phenazopyridine (PYRIDIUM) 100 MG tablet Take 1 tablet (100 mg total) by mouth 3 (three) times daily as needed for pain (bladder pain). 08/12/22   Valentino Nose, NP  rosuvastatin (CRESTOR) 5 MG tablet Take 5 mg by mouth daily.    [provider]  Vitamin D, Ergocalciferol, (DRISDOL) 1.25 MG (50000 UNIT) CAPS capsule Take 50,000 Units by mouth every 7 (seven) days. 10/27/22   [provider]  zolpidem (AMBIEN CR) 12.5 MG CR tablet Take 12.5 mg by mouth at bedtime as needed for sleep.    [provider]    Family History Family History  Problem Relation Age of Onset   Cancer Mother    Heart attack Father    Diabetes Other     Social History Social History   Tobacco Use   Smoking status: Every Day    Types: E-cigarettes   Smokeless tobacco: Never   Tobacco comments:    vapes  Vaping Use   Vaping status: Some Days  Substance Use Topics   Alcohol use: Yes    Comment: rare   Drug use: No     Allergies   Patient has no known allergies.   Review of Systems Review of Systems Per HPI  Physical Exam Triage Vital Signs ED Triage Vitals  Encounter Vitals Group     BP 06/02/23 1510 105/75     Systolic BP Percentile --      Diastolic BP Percentile --      Pulse Rate 06/02/23 1510 (!) 101     Resp 06/02/23 1510 20     Temp 06/02/23 1510 97.9 F (36.6 C)     Temp Source 06/02/23 1510 Oral     SpO2 06/02/23 1510 96 %     Weight --      Height --      Head Circumference --      Peak Flow --      Pain Score 06/02/23 1509 0     Pain Loc --      Pain Education --      Exclude from Growth Chart --    No data found.  Updated Vital Signs BP 105/75 (BP Location: Right Arm)   Pulse (!) 101   Temp 97.9 F (36.6 C) (Oral)   Resp 20   LMP 05/31/2018   SpO2 96%   Visual Acuity Right Eye Distance:   Left Eye Distance:   Bilateral Distance:    Right Eye Near:   Left Eye Near:    Bilateral Near:     Physical Exam Vitals  and nursing note  reviewed.  Constitutional:      General: She is not in acute distress.    Appearance: Normal appearance. She is not ill-appearing or toxic-appearing.  HENT:     Head: Normocephalic and atraumatic.     Right Ear: Tympanic membrane, ear canal and external ear normal.     Left Ear: Tympanic membrane, ear canal and external ear normal.     Nose: No congestion or rhinorrhea.     Mouth/Throat:     Mouth: Mucous membranes are moist.     Pharynx: Oropharynx is clear. No oropharyngeal exudate or posterior oropharyngeal erythema.  Eyes:     General: No scleral icterus.    Extraocular Movements: Extraocular movements intact.  Cardiovascular:     Rate and Rhythm: Normal rate and regular rhythm.  Pulmonary:     Effort: Pulmonary effort is normal. No respiratory distress.     Breath sounds: Normal breath sounds. No wheezing, rhonchi or rales.  Abdominal:     General: Abdomen is flat. Bowel sounds are increased. There is no distension.     Palpations: Abdomen is soft.     Tenderness: There is no abdominal tenderness. There is no guarding or rebound.  Musculoskeletal:     Cervical back: Normal range of motion and neck supple.  Lymphadenopathy:     Cervical: No cervical adenopathy.  Skin:    General: Skin is warm and dry.     Coloration: Skin is not jaundiced or pale.     Findings: No erythema or rash.  Neurological:     Mental Status: She is alert and oriented to person, place, and time.  Psychiatric:        Behavior: Behavior is cooperative.      UC Treatments / Results  Labs (all labs ordered are listed, but only abnormal results are displayed) Labs Reviewed  POC COVID19/FLU A&B COMBO    EKG   Radiology No results found.  Procedures Procedures (including critical care time)  Medications Ordered in UC Medications - No data to display  Initial Impression / Assessment and Plan / UC Course  I have reviewed the triage vital signs and the nursing  notes.  Pertinent labs & imaging results that were available during my care of the patient were reviewed by me and considered in my medical decision making (see chart for details).   Patient is mildly tachycardic in triage, otherwise vital signs are stable.  1. Flu-like symptoms 2. Nausea and vomiting, unspecified vomiting type Vitals and exam are reassuring today Suspect viral etiology, high suspicion for influenza A COVID-19 and influenza testing is negative today Will treat for influenza A with Tamiflu twice daily for 5 days, other supportive care discussed with patient including continuing ibuprofen, Zofran as needed for nausea/vomiting Return and ER precautions discussed Work excuse provided  The patient was given the opportunity to ask questions.  All questions answered to their satisfaction.  The patient is in agreement to this plan.    Final Clinical Impressions(s) / UC Diagnoses   Final diagnoses:  Flu-like symptoms  Nausea and vomiting, unspecified vomiting type     Discharge Instructions      You have a viral upper respiratory infection, most likely influenza.  Your COVID-19 and influenza tests are negative.  Take the Tamiflu as prescribed to treat it.  Symptoms should improve over the next week to 10 days.  If you develop chest pain or shortness of breath, go to the emergency room.  Some things that can make  you feel better are: - Increased rest - Increasing fluid with water/sugar free electrolytes - Acetaminophen and ibuprofen as needed for fever/pain - Salt water gargling, chloraseptic spray and throat lozenges - OTC guaifenesin (Mucinex) 600 mg twice daily for congestion - Saline sinus flushes or a neti pot - Humidifying the air - Zofran every 8 hours as needed for nausea/vomiting     ED Prescriptions     Medication Sig Dispense Auth. Provider   oseltamivir (TAMIFLU) 75 MG capsule Take 1 capsule (75 mg total) by mouth every 12 (twelve) hours. 10 capsule  Cathlean Marseilles A, NP   ondansetron (ZOFRAN-ODT) 4 MG disintegrating tablet Take 1 tablet (4 mg total) by mouth every 8 (eight) hours as needed for nausea or vomiting. 21 tablet Valentino Nose, NP      PDMP not reviewed this encounter.   Valentino Nose, NP 06/02/23 [phone]

## 2023-06-02 NOTE — ED Triage Notes (Signed)
 Pt reports emesis, weakness, fever, chills since this am.

## 2023-06-02 NOTE — Discharge Instructions (Signed)
 You have a viral upper respiratory infection, most likely influenza.  Your COVID-19 and influenza tests are negative.  Take the Tamiflu as prescribed to treat it.  Symptoms should improve over the next week to 10 days.  If you develop chest pain or shortness of breath, go to the emergency room.  Some things that can make you feel better are: - Increased rest - Increasing fluid with water/sugar free electrolytes - Acetaminophen and ibuprofen as needed for fever/pain - Salt water gargling, chloraseptic spray and throat lozenges - OTC guaifenesin (Mucinex) 600 mg twice daily for congestion - Saline sinus flushes or a neti pot - Humidifying the air - Zofran every 8 hours as needed for nausea/vomiting

## 2023-09-04 ENCOUNTER — Ambulatory Visit
Admission: RE | Admit: 2023-09-04 | Discharge: 2023-09-04 | Disposition: A | Discharge status: Home / Self Care | Source: Ambulatory Visit

## 2023-09-04 ENCOUNTER — Telehealth: Payer: Self-pay | Admitting: Nurse Practitioner

## 2023-09-04 ENCOUNTER — Other Ambulatory Visit: Payer: Self-pay

## 2023-09-04 ENCOUNTER — Ambulatory Visit (HOSPITAL_COMMUNITY)
Admission: RE | Admit: 2023-09-04 | Discharge: 2023-09-04 | Disposition: A | Discharge status: Home / Self Care | Source: Ambulatory Visit | Attending: Nurse Practitioner | Admitting: Nurse Practitioner

## 2023-09-04 VITALS — BP 118/82 | HR 85 | Temp 97.4°F | Resp 20

## 2023-09-04 DIAGNOSIS — Z87891 Personal history of nicotine dependence: Secondary | ICD-10-CM | POA: Diagnosis not present

## 2023-09-04 DIAGNOSIS — R0602 Shortness of breath: Secondary | ICD-10-CM

## 2023-09-04 MED ORDER — ALBUTEROL SULFATE HFA 108 (90 BASE) MCG/ACT IN AERS: 2.0000 | INHALATION_SPRAY | Freq: Four times a day (QID) | RESPIRATORY_TRACT | 0 refills | Status: AC | PRN

## 2023-09-04 NOTE — Telephone Encounter (Signed)
 Call patient to discuss chest x-ray result.  Spoke with patient, verified patient with 2 patient identifiers.  Patient was advised x-ray result was negative for active cardiopulmonary disease.  Patient advised to continue with current treatment recommendations to include use of albuterol inhaler and following up with her PCP if symptoms fail to improve.  Patient was in agreement with this plan of care and verbalizes understanding.  All questions were answered.

## 2023-09-04 NOTE — ED Provider Notes (Addendum)
 [facility]    CSN: [id number] Arrival date & time: 09/04/23  1740      History   Chief Complaint Chief Complaint  Patient presents with   Shortness of Breath    HPI Cheryl Dickerson is a 53 y.o. female.   The history is provided by the patient.   Patient presents with a 1 week history of shortness of breath.  Patient reports that she recently quit vaping. She denies fever, chills, cough, wheezing, difficulty breathing, chest pain, abdominal pain, nausea, vomiting, or diarrhea. Patient states that she also has a history of anxiety, she takes Xanax as needed.  States, "this does not feel like my anxiety."   Past Medical History:  Diagnosis Date   Diabetes mellitus without complication (HCC)    High cholesterol    Vertigo     There are no active problems to display for this patient.   Past Surgical History:  Procedure Laterality Date   BALLOON DILATION N/A 06/04/2021   Procedure: BALLOON DILATION;  Surgeon: Vinetta Greening, DO;  Location: [facility];  Service: Endoscopy;  Laterality: N/A;   BIOPSY  06/04/2021   Procedure: BIOPSY;  Surgeon: Vinetta Greening, DO;  [facility]: [facility];  Service: Endoscopy;;   CESAREAN SECTION     CHOLECYSTECTOMY     COLONOSCOPY WITH PROPOFOL  N/A 06/04/2021   Procedure: COLONOSCOPY WITH PROPOFOL ;  Surgeon: Vinetta Greening, DO;  Location: [facility];  Service: Endoscopy;  Laterality: N/A;  9:45am   ESOPHAGOGASTRODUODENOSCOPY (EGD) WITH PROPOFOL  N/A 06/04/2021   Procedure: ESOPHAGOGASTRODUODENOSCOPY (EGD) WITH PROPOFOL ;  Surgeon: Vinetta Greening, DO;  Location: [facility];  Service: Endoscopy;  Laterality: N/A;   EYE SURGERY     KNEE SURGERY      OB History     Gravida  2   Para  2   Term  2   Preterm  0   AB  0   Living         SAB  0   IAB  0   Ectopic  0   Multiple      Live Births               Home Medications    Prior to Admission medications   Medication Sig Start Date End Date  Taking? Authorizing Provider  Semaglutide, 1 MG/DOSE, 2 MG/1.5ML SOPN Inject into the skin.   Yes [provider]  zonisamide (ZONEGRAN) 25 MG capsule Take 100 mg by mouth daily. 08/15/23  Yes [provider]  ALPRAZolam (XANAX) 1 MG tablet Take 1 mg by mouth as needed for anxiety.    [provider]  aspirin-acetaminophen -caffeine (EXCEDRIN MIGRAINE) 250-250-65 MG tablet Take 1 tablet by mouth daily as needed (headache).    [provider]  buPROPion (WELLBUTRIN XL) 150 MG 24 hr tablet Take 150 mg by mouth daily. 10/27/22  Yes [provider]  chlorpheniramine (CHLOR-TRIMETON) 4 MG tablet Take 4 mg by mouth daily as needed for allergies.    [provider]  cyclobenzaprine  (FLEXERIL ) 5 MG tablet Take 1 tablet (5 mg total) by mouth 3 (three) times daily as needed for muscle spasms. Do not drink alcohol or drive while taking this medication.  May cause drowsiness. Patient not taking: Reported on 08/12/2022 03/25/22   Corbin Dess, PA-C  meclizine  (ANTIVERT ) 12.5 MG tablet Take 1 tablet (12.5 mg total) by mouth 3 (three) times daily as needed for dizziness. 08/04/21   Tyrone Gallop,  Laura E, PA-C  meloxicam (MOBIC) 7.5 MG tablet Take 7.5 mg by mouth as needed.    [provider]  metFORMIN (GLUCOPHAGE) 1000 MG tablet Take 1,000 mg by mouth 2 (two) times daily with a meal.   Yes [provider]  omeprazole  (PRILOSEC) 40 MG capsule Take 1 capsule (40 mg total) by mouth 2 (two) times daily before a meal. 06/04/21 09/04/23 Yes Carver, Charles K, DO  ondansetron  (ZOFRAN -ODT) 4 MG disintegrating tablet Take 1 tablet (4 mg total) by mouth every 8 (eight) hours as needed for nausea or vomiting. 06/02/23   Wilhemena Harbour, NP  oseltamivir  (TAMIFLU ) 75 MG capsule Take 1 capsule (75 mg total) by mouth every 12 (twelve) hours. 06/02/23   Wilhemena Harbour, NP  phenazopyridine  (PYRIDIUM ) 100 MG tablet Take 1 tablet (100 mg total) by mouth 3 (three)  times daily as needed for pain (bladder pain). 08/12/22   Wilhemena Harbour, NP  rosuvastatin (CRESTOR) 5 MG tablet Take 5 mg by mouth daily.   Yes [provider]  Vitamin D, Ergocalciferol, (DRISDOL) 1.25 MG (50000 UNIT) CAPS capsule Take 50,000 Units by mouth every 7 (seven) days. 10/27/22  Yes [provider]  zolpidem (AMBIEN CR) 12.5 MG CR tablet Take 12.5 mg by mouth at bedtime as needed for sleep.   Yes [provider]    Family History Family History  Problem Relation Age of Onset   Cancer Mother    Heart attack Father    Diabetes Other     Social History Social History   Tobacco Use   Smoking status: Former    Types: E-cigarettes   Smokeless tobacco: Never   Tobacco comments:    vapes  Vaping Use   Vaping status: Some Days  Substance Use Topics   Alcohol use: Yes    Comment: rare   Drug use: No     Allergies   Patient has no known allergies.   Review of Systems Review of Systems Per HPI  Physical Exam Triage Vital Signs ED Triage Vitals  Encounter Vitals Group     BP 09/04/23 1803 118/82     Systolic BP Percentile --      Diastolic BP Percentile --      Pulse Rate 09/04/23 1803 85     Resp 09/04/23 1803 20     Temp 09/04/23 1803 (!) 97.4 F (36.3 C)     Temp Source 09/04/23 1803 Oral     SpO2 09/04/23 1803 98 %     Weight --      Height --      Head Circumference --      Peak Flow --      Pain Score 09/04/23 1759 0     Pain Loc --      Pain Education --      Exclude from Growth Chart --    No data found.  Updated Vital Signs BP 118/82 (BP Location: Right Arm)   Pulse 85   Temp (!) 97.4 F (36.3 C) (Oral)   Resp 20   LMP 05/31/2018   SpO2 98%   Visual Acuity Right Eye Distance:   Left Eye Distance:   Bilateral Distance:    Right Eye Near:   Left Eye Near:    Bilateral Near:     Physical Exam Vitals and nursing note reviewed.  Constitutional:      General: She is not in acute distress.     Appearance: She is  well-developed.  HENT:     Head: Normocephalic.  Eyes:     Extraocular Movements: Extraocular movements intact.     Conjunctiva/sclera: Conjunctivae normal.     Pupils: Pupils are equal, round, and reactive to light.  Cardiovascular:     Rate and Rhythm: Normal rate and regular rhythm.     Pulses: Normal pulses.     Heart sounds: Normal heart sounds.  Pulmonary:     Effort: Pulmonary effort is normal. No respiratory distress.     Breath sounds: Normal breath sounds. No stridor. No wheezing, rhonchi or rales.  Abdominal:     General: Bowel sounds are normal.     Palpations: Abdomen is soft.  Musculoskeletal:     Cervical back: Normal range of motion.  Skin:    General: Skin is warm and dry.  Neurological:     General: No focal deficit present.     Mental Status: She is alert and oriented to person, place, and time.  Psychiatric:        Mood and Affect: Mood normal.        Behavior: Behavior normal.      UC Treatments / Results  Labs (all labs ordered are listed, but only abnormal results are displayed) Labs Reviewed - No data to display  EKG: Normal sinus rhythm, no ectopy, no STEMI.  Compared to EKG dated 11/10/2022.  No other comparisons available.   Radiology No results found.  Procedures Procedures (including critical care time)  Medications Ordered in UC Medications - No data to display  Initial Impression / Assessment and Plan / UC Course  I have reviewed the triage vital signs and the nursing notes.  Pertinent labs & imaging results that were available during my care of the patient were reviewed by me and considered in my medical decision making (see chart for details).  Chest x-ray is pending.  EKG shows normal sinus rhythm.  Advised patient that if the chest x-ray is negative, will recommend that she follow-up with her primary care physician for further evaluation of her shortness of breath.  In the interim, we will prescribe an albuterol  inhaler for patient's shortness of breath.  Supportive care recommendations were provided and discussed with the patient to include fluids, rest, allowing time for adequate rest, and strict ER follow-up precautions.  Patient was in agreement with this plan of care and verbalizes understanding.  All questions were answered.  Patient stable for discharge.  Final Clinical Impressions(s) / UC Diagnoses   Final diagnoses:  None   Discharge Instructions   None    ED Prescriptions   None    PDMP not reviewed this encounter.   Hardy Lia, NP 09/04/23 1835    Hardy Lia, NP 09/04/23 1910

## 2023-09-04 NOTE — ED Triage Notes (Signed)
 Pt presents to UC for c/o shortness of breath x1 week. Pt reports quitting vaping 3 days ago but had decreased usage this past week before quitting.

## 2023-09-04 NOTE — Discharge Instructions (Addendum)
 You will need to go to the emergency department at [facility] for chest x-ray.  When the results of the x-ray are received, you will be contacted.  You also access to your results via MyChart. Your EKG was negative. Increase fluids and allow for plenty of rest. Make sure you are allowing for adequate rest and recovery. If symptoms do not improve over the next several days, it is recommended that you follow-up with your primary care physician for further evaluation. Go to the emergency department immediately if you experience chest pain, shortness of breath, difficulty breathing, or other concerns. Follow-up as needed.

## 2023-09-07 ENCOUNTER — Ambulatory Visit (HOSPITAL_COMMUNITY): Payer: Self-pay

## 2023-10-23 ENCOUNTER — Ambulatory Visit: Payer: Self-pay

## 2023-10-23 ENCOUNTER — Ambulatory Visit
Admission: EM | Admit: 2023-10-23 | Discharge: 2023-10-23 | Disposition: A | Discharge status: Home / Self Care | Attending: Nurse Practitioner | Admitting: Nurse Practitioner

## 2023-10-23 DIAGNOSIS — L03213 Periorbital cellulitis: Secondary | ICD-10-CM | POA: Diagnosis not present

## 2023-10-23 MED ORDER — SULFAMETHOXAZOLE-TRIMETHOPRIM 800-160 MG PO TABS
1.0000 | ORAL_TABLET | Freq: Two times a day (BID) | ORAL | 0 refills | Status: AC
Start: 2023-10-23 — End: 2023-10-30

## 2023-10-23 NOTE — ED Triage Notes (Signed)
 Pt reports swelling to the left eye x 2 days. Denies injury to the eye, recently exposed to pink eye. Drainage, pain, crusting over some blurred vision.

## 2023-10-23 NOTE — Discharge Instructions (Signed)
 Take medication as prescribed. You may take over-the-counter Tylenol  as needed for pain, fever, or general discomfort. Apply warm compresses to the left eye to help with the pain or discomfort, apply cool compresses to help with pain or swelling. Do not rub or manipulate the eye while symptoms persist. You may use over-the-counter Visine or Clear Eyes to help keep the eye moist and lubricated. Monitor for signs of worsening.  Seek care immediately if you experience increased redness or swelling to the eyelids, or redness or swelling that goes up towards your forehead or down towards your face, or worsening symptoms. Follow-up with your PCP within the next 7 to 10 days for reevaluation. Follow-up as needed.

## 2023-10-23 NOTE — ED Provider Notes (Signed)
 [facility]    CSN: [id number] Arrival date & time: 10/23/23  9070      History   Chief Complaint No chief complaint on file.   HPI Cheryl Dickerson is a 53 y.o. female.   The history is provided by the patient.   Patient presents for complaints of pain and swelling to the left upper eyelid.  Patient states symptoms have been present for the past 2 days.  She denies injury or trauma.  States that she has noticed some drainage from the inner portion of the left upper eyelid.  Patient states that the area is painful and tender to touch.  She denies visual changes, fever, chills, itching, dizziness, light sensitivity or lightheadedness.  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  Patient states that she does wear eyeglasses.  Past Medical History:  Diagnosis Date   Diabetes mellitus without complication (HCC)    High cholesterol    Vertigo     There are no active problems to display for this patient.   Past Surgical History:  Procedure Laterality Date   BALLOON DILATION N/A 06/04/2021   Procedure: BALLOON DILATION;  Surgeon: Cindie Carlin POUR, DO;  Location: [facility];  Service: Endoscopy;  Laterality: N/A;   BIOPSY  06/04/2021   Procedure: BIOPSY;  Surgeon: Cindie Carlin POUR, DO;  Location: [facility];  Service: Endoscopy;;   CESAREAN SECTION     CHOLECYSTECTOMY     COLONOSCOPY WITH PROPOFOL  N/A 06/04/2021   Procedure: COLONOSCOPY WITH PROPOFOL ;  Surgeon: Cindie Carlin POUR, DO;  Location: [facility];  Service: Endoscopy;  Laterality: N/A;  9:45am   ESOPHAGOGASTRODUODENOSCOPY (EGD) WITH PROPOFOL  N/A 06/04/2021   Procedure: ESOPHAGOGASTRODUODENOSCOPY (EGD) WITH PROPOFOL ;  Surgeon: Cindie Carlin POUR, DO;  Location: [facility];  Service: Endoscopy;  Laterality: N/A;   EYE SURGERY     KNEE SURGERY      OB History     Gravida  2    Para  2   Term  2   Preterm  0   AB  0   Living         SAB  0   IAB  0   Ectopic  0   Multiple      Live Births               Home Medications    Prior to Admission medications   Medication Sig Start Date End Date Taking? Authorizing Provider  sulfamethoxazole -trimethoprim  (BACTRIM  DS) 800-160 MG tablet Take 1 tablet by mouth 2 (two) times daily for 7 days. 10/23/23 10/30/23 Yes Leath-Warren, Etta PARAS, NP  albuterol  (VENTOLIN  HFA) 108 (90 Base) MCG/ACT inhaler Inhale 2 puffs into the lungs every 6 (six) hours as needed. 09/04/23   Leath-Warren, Etta PARAS, NP  ALPRAZolam (XANAX) 1 MG tablet Take 1 mg by mouth as needed for anxiety.    [provider]  aspirin-acetaminophen -caffeine (EXCEDRIN MIGRAINE) 250-250-65 MG tablet Take 1 tablet by mouth daily as needed (headache).    [provider]  buPROPion (WELLBUTRIN XL) 150 MG 24 hr tablet Take 150 mg by mouth daily. 10/27/22   [provider]  chlorpheniramine (CHLOR-TRIMETON) 4 MG tablet Take 4 mg by mouth daily as needed for allergies.    [provider]  cyclobenzaprine  (FLEXERIL ) 5 MG tablet Take 1 tablet (5 mg total) by  mouth 3 (three) times daily as needed for muscle spasms. Do not drink alcohol or drive while taking this medication.  May cause drowsiness. Patient not taking: Reported on 08/12/2022 03/25/22   Cheryl Vernell Norris, PA-C  meclizine  (ANTIVERT ) 12.5 MG tablet Take 1 tablet (12.5 mg total) by mouth 3 (three) times daily as needed for dizziness. 08/04/21   Graham, Laura E, PA-C  meloxicam (MOBIC) 7.5 MG tablet Take 7.5 mg by mouth as needed.    [provider]  metFORMIN (GLUCOPHAGE) 1000 MG tablet Take 1,000 mg by mouth 2 (two) times daily with a meal.    [provider]  omeprazole  (PRILOSEC) 40 MG capsule Take 1 capsule (40 mg total) by mouth 2 (two) times daily before a meal. 06/04/21 09/04/23  Cindie Carlin POUR, DO  ondansetron  (ZOFRAN -ODT) 4 MG  disintegrating tablet Take 1 tablet (4 mg total) by mouth every 8 (eight) hours as needed for nausea or vomiting. 06/02/23   Chandra Harlene LABOR, NP  oseltamivir  (TAMIFLU ) 75 MG capsule Take 1 capsule (75 mg total) by mouth every 12 (twelve) hours. 06/02/23   Chandra Harlene LABOR, NP  phenazopyridine  (PYRIDIUM ) 100 MG tablet Take 1 tablet (100 mg total) by mouth 3 (three) times daily as needed for pain (bladder pain). 08/12/22   Chandra Harlene LABOR, NP  rosuvastatin (CRESTOR) 5 MG tablet Take 5 mg by mouth daily.    [provider]  Semaglutide, 1 MG/DOSE, 2 MG/1.5ML SOPN Inject into the skin.    [provider]  Vitamin D, Ergocalciferol, (DRISDOL) 1.25 MG (50000 UNIT) CAPS capsule Take 50,000 Units by mouth every 7 (seven) days. 10/27/22   [provider]  zolpidem (AMBIEN CR) 12.5 MG CR tablet Take 12.5 mg by mouth at bedtime as needed for sleep.    [provider]  zonisamide (ZONEGRAN) 25 MG capsule Take 100 mg by mouth daily. 08/15/23   [provider]    Family History Family History  Problem Relation Age of Onset   Cancer Mother    Heart attack Father    Diabetes Other     Social History Social History   Tobacco Use   Smoking status: Former    Types: E-cigarettes   Smokeless tobacco: Never   Tobacco comments:    vapes  Vaping Use   Vaping status: Some Days  Substance Use Topics   Alcohol use: Yes    Comment: rare   Drug use: No     Allergies   Patient has no known allergies.   Review of Systems Review of Systems Per HPI  Physical Exam Triage Vital Signs ED Triage Vitals  Encounter Vitals Group     BP 10/23/23 0933 109/74     Girls Systolic BP Percentile --      Girls Diastolic BP Percentile --      Boys Systolic BP Percentile --      Boys Diastolic BP Percentile --      Pulse Rate 10/23/23 0933 (!) 105     Resp 10/23/23 0933 20     Temp 10/23/23 0933 98.2 F (36.8 C)     Temp Source 10/23/23 0933 Oral     SpO2  10/23/23 0933 97 %     Weight --      Height --      Head Circumference --      Peak Flow --      Pain Score 10/23/23 0936 10     Pain Loc --  Pain Education --      Exclude from Growth Chart --    No data found.  Updated Vital Signs BP 109/74 (BP Location: Right Arm)   Pulse (!) 105   Temp 98.2 F (36.8 C) (Oral)   Resp 20   LMP 05/31/2018   SpO2 97%   Visual Acuity Right Eye Distance: 20/20 Left Eye Distance: 20/25 Bilateral Distance: 20/20  Right Eye Near:   Left Eye Near:    Bilateral Near:     Physical Exam Vitals and nursing note reviewed.  Constitutional:      General: She is not in acute distress.    Appearance: Normal appearance.  HENT:     Head: Normocephalic.     Nose: Nose normal.  Eyes:     General:        Left eye: Discharge (Inner aspect of left upper eyelid) present.    Extraocular Movements: Extraocular movements intact.     Conjunctiva/sclera: Conjunctivae normal.     Pupils: Pupils are equal, round, and reactive to light.     Comments: Moderate swelling noted to the left upper eyelid.  There is yellowish drainage noted at the inner canthus of the left upper eyelid.  Conjunctiva is within normal limits.  There is no bruising present.  Cardiovascular:     Rate and Rhythm: Normal rate and regular rhythm.  Pulmonary:     Effort: Pulmonary effort is normal.  Musculoskeletal:     Cervical back: Normal range of motion.  Skin:    General: Skin is warm and dry.  Neurological:     General: No focal deficit present.     Mental Status: She is alert and oriented to person, place, and time.  Psychiatric:        Mood and Affect: Mood normal.        Behavior: Behavior normal.      UC Treatments / Results  Labs (all labs ordered are listed, but only abnormal results are displayed) Labs Reviewed - No data to display  EKG   Radiology No results found.  Procedures Procedures (including critical care time)  Medications Ordered in  UC Medications - No data to display  Initial Impression / Assessment and Plan / UC Course  I have reviewed the triage vital signs and the nursing notes.  Pertinent labs & imaging results that were available during my care of the patient were reviewed by me and considered in my medical decision making (see chart for details).  Symptoms consistent with preseptal cellulitis of the left upper light lid.  Patient with moderate swelling and redness to the left upper eyelid.  Conjunctiva are within normal limits, do not suspect conjunctivitis at this time.  Visual acuity was intact.  Will treat with Bactrim  DS 800/160 mg twice daily for the next 7 days.  Patient advised to discontinue use of the ofloxacin eyedrops she is started.  Supportive care recommendations were provided and discussed with the patient to include over-the-counter analgesics, warm or cool compresses to the eye, and to monitor for worsening.  Patient was given strict ER follow-up precautions.  Patient was in agreement with this plan of care and verbalizes understanding.  All questions were answered.  Patient stable for discharge.   Final Clinical Impressions(s) / UC Diagnoses   Final diagnoses:  Preseptal cellulitis of left upper eyelid     Discharge Instructions      Take medication as prescribed. You may take over-the-counter Tylenol  as needed for pain, fever,  or general discomfort. Apply warm compresses to the left eye to help with the pain or discomfort, apply cool compresses to help with pain or swelling. Do not rub or manipulate the eye while symptoms persist. You may use over-the-counter Visine or Clear Eyes to help keep the eye moist and lubricated. Monitor for signs of worsening.  Seek care immediately if you experience increased redness or swelling to the eyelids, or redness or swelling that goes up towards your forehead or down towards your face, or worsening symptoms. Follow-up with your PCP within the next 7 to 10  days for reevaluation. Follow-up as needed.     ED Prescriptions     Medication Sig Dispense Auth. Provider   sulfamethoxazole -trimethoprim  (BACTRIM  DS) 800-160 MG tablet Take 1 tablet by mouth 2 (two) times daily for 7 days. 14 tablet Leath-Warren, Etta PARAS, NP      PDMP not reviewed this encounter.   Gilmer Etta PARAS, NP 10/23/23 1008

## 2024-04-14 ENCOUNTER — Other Ambulatory Visit (HOSPITAL_COMMUNITY): Payer: Self-pay | Admitting: Physician Assistant

## 2024-04-14 ENCOUNTER — Encounter (HOSPITAL_BASED_OUTPATIENT_CLINIC_OR_DEPARTMENT_OTHER): Payer: Self-pay

## 2024-04-14 ENCOUNTER — Ambulatory Visit (HOSPITAL_BASED_OUTPATIENT_CLINIC_OR_DEPARTMENT_OTHER)
Admission: RE | Admit: 2024-04-14 | Discharge: 2024-04-14 | Disposition: A | Discharge status: Home / Self Care | Source: Ambulatory Visit | Attending: Physician Assistant | Admitting: Physician Assistant

## 2024-04-14 DIAGNOSIS — R0602 Shortness of breath: Secondary | ICD-10-CM | POA: Insufficient documentation

## 2024-04-14 LAB — POCT I-STAT CREATININE: Creatinine, Ser: 0.8 mg/dL (ref 0.44–1.00)

## 2024-04-14 MED ORDER — IOHEXOL 350 MG/ML SOLN
100.0000 mL | Freq: Once | INTRAVENOUS | Status: AC | PRN
  Administered 2024-04-14: 75 mL via INTRAVENOUS
